# Patient Record
Sex: Female | Born: 1975 | Race: Asian | Hispanic: No | Marital: Single | Smoking: Former smoker
Health system: Southern US, Community
[De-identification: ages and names within clinical notes are randomized; demographics above are authoritative.]

## PROBLEM LIST (undated history)

## (undated) DIAGNOSIS — I10 Essential (primary) hypertension: Secondary | ICD-10-CM

## (undated) DIAGNOSIS — Z8742 Personal history of other diseases of the female genital tract: Secondary | ICD-10-CM

## (undated) DIAGNOSIS — J45909 Unspecified asthma, uncomplicated: Secondary | ICD-10-CM

## (undated) DIAGNOSIS — F32A Depression, unspecified: Secondary | ICD-10-CM

## (undated) DIAGNOSIS — F329 Major depressive disorder, single episode, unspecified: Secondary | ICD-10-CM

## (undated) HISTORY — DX: Personal history of other diseases of the female genital tract: Z87.42

## (undated) HISTORY — DX: Depression, unspecified: F32.A

## (undated) HISTORY — DX: Essential (primary) hypertension: I10

## (undated) HISTORY — DX: Unspecified asthma, uncomplicated: J45.909

## (undated) HISTORY — PX: NO PAST SURGERIES: SHX2092

## (undated) HISTORY — DX: Major depressive disorder, single episode, unspecified: F32.9

---

## 2017-08-03 ENCOUNTER — Encounter: Payer: Self-pay | Admitting: Family Medicine

## 2017-08-03 ENCOUNTER — Ambulatory Visit: Payer: BLUE CROSS/BLUE SHIELD | Admitting: Family Medicine

## 2017-08-03 VITALS — BP 172/112 | HR 82 | Temp 99.3°F | Ht 64.0 in | Wt 227.0 lb

## 2017-08-03 DIAGNOSIS — Z720 Tobacco use: Secondary | ICD-10-CM

## 2017-08-03 DIAGNOSIS — I10 Essential (primary) hypertension: Secondary | ICD-10-CM

## 2017-08-03 DIAGNOSIS — Z309 Encounter for contraceptive management, unspecified: Secondary | ICD-10-CM | POA: Diagnosis not present

## 2017-08-03 DIAGNOSIS — Z23 Encounter for immunization: Secondary | ICD-10-CM

## 2017-08-03 DIAGNOSIS — E282 Polycystic ovarian syndrome: Secondary | ICD-10-CM | POA: Diagnosis not present

## 2017-08-03 LAB — BASIC METABOLIC PANEL
BUN: 13 mg/dL (ref 7–25)
CHLORIDE: 100 mmol/L (ref 98–110)
CO2: 29 mmol/L (ref 20–32)
Calcium: 9.5 mg/dL (ref 8.6–10.2)
Creat: 0.68 mg/dL (ref 0.50–1.10)
GLUCOSE: 96 mg/dL (ref 65–99)
POTASSIUM: 4 mmol/L (ref 3.5–5.3)
SODIUM: 138 mmol/L (ref 135–146)

## 2017-08-03 LAB — POCT URINE PREGNANCY: Preg Test, Ur: NEGATIVE

## 2017-08-03 MED ORDER — METFORMIN HCL 500 MG PO TABS: ORAL_TABLET | ORAL | 1 refills | Status: DC

## 2017-08-03 MED ORDER — CHLORTHALIDONE 25 MG PO TABS: 12.5000 mg | ORAL_TABLET | Freq: Every day | ORAL | 1 refills | Status: DC

## 2017-08-03 NOTE — Progress Notes (Signed)
Pre visit review using our clinic review tool, if applicable. No additional management support is needed unless otherwise documented below in the visit note. 

## 2017-08-03 NOTE — Progress Notes (Signed)
Chief Complaint  Patient presents with  . Establish Care       New Patient Visit SUBJECTIVE: HPI: Tresea MallShanti Suman is an 42 y.o.female who is being seen for establishing care.  The patient was previously seen at office in [place].  Hypertension Patient presents for hypertension follow up. She does monitor home blood pressures. She is not currently on any medications. Had cough with ACEi. Metoprolol did not help with her issue. She is now adhering to a healthy diet overall. Exercise: walking +Famhx of HTN.  +20 yr hx of smoking, 1/2 ppd. Has tried Chantix in past, has never had PCV23.   PCOS- has been on Nuvaring in past, wondering if there is anything else, would also like something for contraception.   Allergies  Allergen Reactions  . Lisinopril Cough    Side effect    Past Medical History:  Diagnosis Date  . Depression   . History of PCOS   . Hypertension   . UTI (urinary tract infection)    History reviewed. No pertinent surgical history.  Family History  Problem Relation Age of Onset  . Depression Mother   . Hypertension Mother   . Depression Father   . Hypertension Father   . Hyperlipidemia Father   . Heart disease Father   . Heart attack Father      Current Outpatient Medications:  .  chlorthalidone (HYGROTON) 25 MG tablet, Take 0.5 tablets (12.5 mg total) by mouth daily., Disp: 30 tablet, Rfl: 1 .  metFORMIN (GLUCOPHAGE) 500 MG tablet, Week 1: 1 tab daily Week 2: 1 tab twice daily Week 3: 2 tabs in AM, 1 in evening Week 4: 2 tabs twice daily, Disp: 120 tablet, Rfl: 1  ROS Cardiovascular: Denies chest pain  Respiratory: Denies dyspnea   OBJECTIVE: BP (!) 172/112 (BP Location: Left Arm, Patient Position: Sitting, Cuff Size: Large)   Pulse 82   Temp 99.3 F (37.4 C) (Oral)   Ht 5\' 4"  (1.626 m)   Wt 227 lb (103 kg)   SpO2 98%   BMI 38.96 kg/m   Constitutional: -  VS reviewed -  Well developed, well nourished, appears stated age -  No apparent  distress  Psychiatric: -  Oriented to person, place, and time -  Memory intact -  Affect and mood normal -  Fluent conversation, good eye contact -  Judgment and insight age appropriate  Eye: -  Conjunctivae clear, no discharge -  Pupils symmetric, round, reactive to light  ENMT: -  MMM    Pharynx moist, no exudate, no erythema  Neck: -  No gross swelling, no palpable masses -  Thyroid midline, not enlarged, mobile, no palpable masses  Cardiovascular: -  RRR -  No LE edema  Respiratory: -  Normal respiratory effort, no accessory muscle use, no retraction -  Breath sounds equal, no wheezes, no ronchi, no crackles  Musculoskeletal: -  No clubbing, no cyanosis -  Gait normal  Skin: -  No significant lesion on inspection -  Warm and dry to palpation   ASSESSMENT/PLAN: PCOS (polycystic ovarian syndrome) - Plan: metFORMIN (GLUCOPHAGE) 500 MG tablet  Encounter for contraceptive management, unspecified type - Plan: POCT urine pregnancy  Tobacco abuse  Essential hypertension - Plan: chlorthalidone (HYGROTON) 25 MG tablet, Basic metabolic panel  Need for vaccination against Streptococcus pneumoniae - Plan: Pneumococcal polysaccharide vaccine 23-valent greater than or equal to 2yo subcutaneous/IM  Patient instructed to sign release of records form from her previous PCP. Start metformin.  Estrogen-containing formulations are contraindicated as she is greater than 30 yo and smokes.  She does request some form of contraception and is interested in Nexplanon.  We will check a urine pregnancy today, which is neg, and again in 2 weeks.  We will also recheck a BMP in 2 weeks.  Counseled on diet and exercise.  Challenged her to do yoga and lift weights.  Healthy diet handout given. Patient should return in 2 weeks. The patient voiced understanding and agreement to the plan.   Jilda Roche [place], DO 08/03/17  5:02 PM

## 2017-08-03 NOTE — Patient Instructions (Addendum)
Aim to do some physical exertion for 150 minutes per week. This is typically divided into 5 days per week, 30 minutes per day. The activity should be enough to get your heart rate up. Anything is better than nothing if you have time constraints. Consider lifting weights, yoga or resistance bands.    No unprotected intercourse between now and in 2 weeks.   Healthy Eating Plan Many factors influence your heart health, including eating and exercise habits. Heart (coronary) risk increases with abnormal blood fat (lipid) levels. Heart-healthy meal planning includes limiting unhealthy fats, increasing healthy fats, and making other small dietary changes. This includes maintaining a healthy body weight to help keep lipid levels within a normal range.  WHAT IS MY PLAN?  Your health care provider recommends that you:  Drink a glass of water before meals to help with satiety.  Eat slowly.  An alternative to the water is to add Metamucil. This will help with satiety as well. It does contain calories, unlike water.  WHAT TYPES OF FAT SHOULD I CHOOSE?  Choose healthy fats more often. Choose monounsaturated and polyunsaturated fats, such as olive oil and canola oil, flaxseeds, walnuts, almonds, and seeds.  Eat more omega-3 fats. Good choices include salmon, mackerel, sardines, tuna, flaxseed oil, and ground flaxseeds. Aim to eat fish at least two times each week.  Avoid foods with partially hydrogenated oils in them. These contain trans fats. Examples of foods that contain trans fats are stick margarine, some tub margarines, cookies, crackers, and other baked goods. If you are going to avoid a fat, this is the one to avoid!  WHAT GENERAL GUIDELINES DO I NEED TO FOLLOW?  Check food labels carefully to identify foods with trans fats. Avoid these types of options when possible.  Fill one half of your plate with vegetables and green salads. Eat 4-5 servings of vegetables per day. A serving of vegetables  equals 1 cup of raw leafy vegetables,  cup of raw or cooked cut-up vegetables, or  cup of vegetable juice.  Fill one fourth of your plate with whole grains. Look for the word "whole" as the first word in the ingredient list.  Fill one fourth of your plate with lean protein foods.  Eat 4-5 servings of fruit per day. A serving of fruit equals one medium whole fruit,  cup of dried fruit,  cup of fresh, frozen, or canned fruit. Try to avoid fruits in cups/syrups as the sugar content can be high.  Eat more foods that contain soluble fiber. Examples of foods that contain this type of fiber are apples, broccoli, carrots, beans, peas, and barley. Aim to get 20-30 g of fiber per day.  Eat more home-cooked food and less restaurant, buffet, and fast food.  Limit or avoid alcohol.  Limit foods that are high in starch and sugar.  Avoid fried foods when able.  Cook foods by using methods other than frying. Baking, boiling, grilling, and broiling are all great options. Other fat-reducing suggestions include: ? Removing the skin from poultry. ? Removing all visible fats from meats. ? Skimming the fat off of stews, soups, and gravies before serving them. ? Steaming vegetables in water or broth.  Lose weight if you are overweight. Losing just 5-10% of your initial body weight can help your overall health and prevent diseases such as diabetes and heart disease.  Increase your consumption of nuts, legumes, and seeds to 4-5 servings per week. One serving of dried beans or legumes equals  cup after being cooked, one serving of nuts equals 1 ounces, and one serving of seeds equals  ounce or 1 tablespoon.  WHAT ARE GOOD FOODS CAN I EAT? Grains Grainy breads (try to find bread that is 3 g of fiber per slice or greater), oatmeal, light popcorn. Whole-grain cereals. Rice and pasta, including brown rice and those that are made with whole wheat. Edamame pasta is a great alternative to grain pasta. It has a  higher protein content. Try to avoid significant consumption of white bread, sugary cereals, or pastries/baked goods.  Vegetables All vegetables. Cooked white potatoes do not count as vegetables.  Fruits All fruits, but limit pineapple and bananas as these fruits have a higher sugar content.  Meats and Other Protein Sources Lean, well-trimmed beef, veal, pork, and lamb. Chicken and [place] without skin. All fish and shellfish. Wild duck, rabbit, pheasant, and venison. Egg whites or low-cholesterol egg substitutes. Dried beans, peas, lentils, and tofu.Seeds and most nuts.  Dairy Low-fat or nonfat cheeses, including ricotta, string, and mozzarella. Skim or 1% milk that is liquid, powdered, or evaporated. Buttermilk that is made with low-fat milk. Nonfat or low-fat yogurt. Soy/Almond milk are good alternatives if you cannot handle dairy.  Beverages Water is the best for you. Sports drinks with less sugar are more desirable unless you are a highly active athlete.  Sweets and Desserts Sherbets and fruit ices. Honey, jam, marmalade, jelly, and syrups. Dark chocolate.  Eat all sweets and desserts in moderation.  Fats and Oils Nonhydrogenated (trans-free) margarines. Vegetable oils, including soybean, sesame, sunflower, olive, peanut, safflower, corn, canola, and cottonseed. Salad dressings or mayonnaise that are made with a vegetable oil. Limit added fats and oils that you use for cooking, baking, salads, and as spreads.  Other Cocoa powder. Coffee and tea. Most condiments.  The items listed above may not be a complete list of recommended foods or beverages. Contact your dietitian for more options.

## 2017-08-08 ENCOUNTER — Telehealth: Payer: Self-pay | Admitting: *Deleted

## 2017-08-08 ENCOUNTER — Encounter: Payer: Self-pay | Admitting: Family Medicine

## 2017-08-08 NOTE — Telephone Encounter (Signed)
[facility] Henry MD; forwarded to provider/SLS 04/17

## 2017-08-17 ENCOUNTER — Ambulatory Visit: Payer: BLUE CROSS/BLUE SHIELD | Admitting: Family Medicine

## 2017-08-27 ENCOUNTER — Encounter: Payer: Self-pay | Admitting: Family Medicine

## 2017-08-27 ENCOUNTER — Ambulatory Visit: Payer: BLUE CROSS/BLUE SHIELD | Admitting: Family Medicine

## 2017-08-27 VITALS — BP 154/92 | HR 97 | Temp 98.6°F | Ht 64.0 in | Wt 222.1 lb

## 2017-08-27 DIAGNOSIS — I10 Essential (primary) hypertension: Secondary | ICD-10-CM | POA: Diagnosis not present

## 2017-08-27 DIAGNOSIS — Z30017 Encounter for initial prescription of implantable subdermal contraceptive: Secondary | ICD-10-CM

## 2017-08-27 LAB — POCT URINE PREGNANCY: Preg Test, Ur: NEGATIVE

## 2017-08-27 MED ORDER — ETONOGESTREL 68 MG ~~LOC~~ IMPL
68.0000 mg | DRUG_IMPLANT | Freq: Once | SUBCUTANEOUS | Status: AC
  Administered 2017-08-27: 68 mg via SUBCUTANEOUS

## 2017-08-27 MED ORDER — CHLORTHALIDONE 25 MG PO TABS: 25.0000 mg | ORAL_TABLET | Freq: Every day | ORAL | 1 refills | Status: DC

## 2017-08-27 NOTE — Progress Notes (Signed)
Chief Complaint  Patient presents with  . Procedure    nexplanon    Subjective Philis Doke is a 42 y.o. adult who presents for hypertension follow up. She was started on 12.5 mg chlorthalidone. She is compliant with medication- chlorthalidone 12.5 mg/d. Patient has these side effects of medication: none She is adhering to a healthy diet overall. Current exercise: walking  She is also here for insertion of Nexplanon. She was explained the risks and benefits and wishes to proceed. Pregnancy test neg on 4/12 and today.  Past Medical History:  Diagnosis Date  . Asthma   . Depression   . History of PCOS   . Hypertension     Review of Systems Cardiovascular: no chest pain Respiratory:  no shortness of breath  Exam BP (!) 154/92 (BP Location: Right Arm, Patient Position: Sitting, Cuff Size: Large)   Pulse 97   Temp 98.6 F (37 C) (Oral)   Ht  (1.626 m)   Wt 222 lb 2 oz (100.8 kg)   SpO2 97%   BMI 38.13 kg/m  General:  well developed, well nourished, in no apparent distress Heart: RRR, no bruits, no LE edema Lungs: clear to auscultation, no accessory muscle use Psych: well oriented with normal range of affect and appropriate judgment/insight  Procedure Note, Nexplanon placement: Informed consent obtained. The patient was placed in a comfortable supine position and the non-dominant arm, left, was positioned to access the sulcus between the bicep and triceps muscles.  Measurements were made, and markings were placed at 8 cm, 10 cm, 12 cm and 14 cm from the medial epicondyle of the humerus, 2 cm posterior to sulcus. The area was prepped with Hibiclens and a 30 gauge needle was used to inject 3 mL of 1% lidocaine with epinephrine.  Sterile gloves were then utilized. The Nexplanon trocar was then inserted at the end of anesthetized area and pushed gently through the subcutaneous tissue along the line of the sulcus.  The seal was broken and the implant was held in place  while the trocar was withdrawn.  The implant was then palpated by both me, pt declined.   The arm was cleansed and the puncture site from the trocar covered with triple antibiotic ointment and pressure dressing.  Hemostasis was observed at the site. There were no complications noted.  The patient tolerated the procedure well.  She was instructed that the device must be removed in three years.  Essential hypertension - Plan: chlorthalidone (HYGROTON) 25 MG tablet, Basic Metabolic Panel (BMET)  Nexplanon insertion - Plan: POCT urine pregnancy, etonogestrel (NEXPLANON) implant 68 mg  Orders as above. Increase dose of chlorthalidone to 25 mg/d from 12.5 mg/d. Ck BMP today. Nexplanon insertion success. Pt declined palpation. Counseled on diet and exercise. F/u in 1 mo. The patient voiced understanding and agreement to the plan.  Jilda Roche [place], DO 08/27/17  4:34 PM

## 2017-08-27 NOTE — Progress Notes (Signed)
Pre visit review using our clinic review tool, if applicable. No additional management support is needed unless otherwise documented below in the visit note. 

## 2017-08-27 NOTE — Patient Instructions (Signed)
No unprotected intercourse (or use back up contraception) for the next 7 days.  Do not shower for the rest of the day. When you do wash it, use only soap and water. Do not vigorously scrub. Apply triple antibiotic ointment (like Neosporin) twice daily. Keep the area clean and dry.   Things to look out for: increasing pain not relieved by ibuprofen/acetaminophen, fevers, spreading redness, drainage of pus, or foul odor.  Keep checking blood pressure.   Start taking 1 tab daily.   Let us know if you need anything.

## 2017-08-28 LAB — BASIC METABOLIC PANEL
BUN: 18 mg/dL (ref 6–23)
CALCIUM: 9.8 mg/dL (ref 8.4–10.5)
CO2: 30 meq/L (ref 19–32)
Chloride: 104 mEq/L (ref 96–112)
Creatinine, Ser: 0.98 mg/dL (ref 0.40–1.20)
GFR: 66.46 mL/min (ref >60.00)
Glucose, Bld: 82 mg/dL (ref 70–99)
Potassium: 5 mEq/L (ref 3.5–5.1)
SODIUM: 143 meq/L (ref 135–145)

## 2017-09-28 ENCOUNTER — Ambulatory Visit (INDEPENDENT_AMBULATORY_CARE_PROVIDER_SITE_OTHER): Payer: BLUE CROSS/BLUE SHIELD

## 2017-09-28 DIAGNOSIS — E282 Polycystic ovarian syndrome: Secondary | ICD-10-CM | POA: Diagnosis not present

## 2017-09-28 DIAGNOSIS — I10 Essential (primary) hypertension: Secondary | ICD-10-CM | POA: Diagnosis not present

## 2017-09-28 MED ORDER — METFORMIN HCL 500 MG PO TABS: ORAL_TABLET | ORAL | 1 refills | Status: DC

## 2017-09-28 MED ORDER — AMLODIPINE BESYLATE 5 MG PO TABS: 5.0000 mg | ORAL_TABLET | Freq: Every day | ORAL | 1 refills | Status: DC

## 2017-09-28 NOTE — Progress Notes (Signed)
Pt here for Blood pressure check per Dr. Carmelia RollerWendling  Pt currently takes: Hygroton 25mg  take 1 tablet daily   Pt reports compliance with medication: None  BP today @ =175/93 HR =91  Pt advised per PCP start  amlodipine  5mg  daily. Added to the hygroton. Patient made aware and voiced her understanding.

## 2017-09-28 NOTE — Progress Notes (Signed)
Noted. Agree with above.  Jasmine Higgins [place], DO 09/28/17 4:56 PM

## 2017-10-10 ENCOUNTER — Encounter: Payer: Self-pay | Admitting: Family Medicine

## 2017-10-12 ENCOUNTER — Encounter: Payer: Self-pay | Admitting: Family Medicine

## 2017-10-12 DIAGNOSIS — I10 Essential (primary) hypertension: Secondary | ICD-10-CM

## 2017-10-12 MED ORDER — CHLORTHALIDONE 25 MG PO TABS: 25.0000 mg | ORAL_TABLET | Freq: Every day | ORAL | 1 refills | Status: DC

## 2017-10-13 ENCOUNTER — Other Ambulatory Visit: Payer: Self-pay | Admitting: Family Medicine

## 2017-10-13 DIAGNOSIS — E282 Polycystic ovarian syndrome: Secondary | ICD-10-CM

## 2017-10-29 ENCOUNTER — Encounter: Payer: Self-pay | Admitting: Family Medicine

## 2017-10-29 ENCOUNTER — Ambulatory Visit: Payer: BLUE CROSS/BLUE SHIELD | Admitting: Family Medicine

## 2017-10-29 VITALS — BP 128/90 | HR 92 | Temp 98.5°F | Resp 16 | Wt 213.6 lb

## 2017-10-29 DIAGNOSIS — N939 Abnormal uterine and vaginal bleeding, unspecified: Secondary | ICD-10-CM

## 2017-10-29 DIAGNOSIS — I10 Essential (primary) hypertension: Secondary | ICD-10-CM | POA: Diagnosis not present

## 2017-10-29 MED ORDER — AMLODIPINE BESYLATE 10 MG PO TABS: 10.0000 mg | ORAL_TABLET | Freq: Every day | ORAL | 3 refills | Status: DC

## 2017-10-29 MED ORDER — NORGESTIMATE-ETH ESTRADIOL 0.25-35 MG-MCG PO TABS: ORAL_TABLET | ORAL | 11 refills | Status: DC

## 2017-10-29 MED ORDER — METFORMIN HCL 1000 MG PO TABS: 1000.0000 mg | ORAL_TABLET | Freq: Two times a day (BID) | ORAL | 3 refills | Status: DC

## 2017-10-29 NOTE — Patient Instructions (Addendum)
Take 2 tabs daily of amlodipine until you run out. I have called in a new strength.   A new dose of your metformin has been called in.  Keep up the great work with losing weight, eating cleaner, and exercising.  Let us know if you need anything.

## 2017-10-29 NOTE — Progress Notes (Signed)
Chief Complaint  Patient presents with  . Hypertension    Subjective Jasmine Higgins is a 42 y.o. female who presents for hypertension follow up. She does not monitor home blood pressures. She is compliant with medications- Norvasc 5 mg/d, chlorthalidone 25 mg/d. Patient has these side effects of medication: none She is adhering to a healthy diet overall. Current exercise: walking  Has bleeding on Nexplanon. Has taken Aleve which has made it lighter, but not fully better yet.    Past Medical History:  Diagnosis Date  . Asthma   . Depression   . History of PCOS   . Hypertension     Review of Systems Cardiovascular: no chest pain Respiratory:  no shortness of breath  Exam BP 128/90 (BP Location: Left Arm, Patient Position: Sitting, Cuff Size: Large)   Pulse 92   Temp 98.5 F (36.9 C) (Oral)   Resp 16   Wt 213 lb 9.6 oz (96.9 kg)   SpO2 96%   BMI 36.66 kg/m  General:  well developed, well nourished, in no apparent distress Heart: RRR, no bruits, no LE edema Lungs: clear to auscultation, no accessory muscle use Psych: well oriented with normal range of affect and appropriate judgment/insight  Essential hypertension - Plan: amLODipine (NORVASC) 10 MG tablet  Increase dose of above to 10 mg/d. Counseled on diet and exercise. She is doing pretty well. [facility] breakthrough bleeding on implant. F/u in 6 weeks. The patient voiced understanding and agreement to the plan.  Jilda Rocheicholas [place], DO 10/29/17  3:56 PM

## 2017-11-11 ENCOUNTER — Encounter: Payer: Self-pay | Admitting: Family Medicine

## 2017-11-14 NOTE — Telephone Encounter (Signed)
Pt calling back and states she can come in tomorrow at 4:15pm. Please schedule pt. I do not have security to be able to place the pt on the schedule at that time.

## 2017-11-14 NOTE — Telephone Encounter (Signed)
Please schedule appt for Nexplanon removal. TY.

## 2017-11-15 ENCOUNTER — Encounter: Payer: Self-pay | Admitting: Family Medicine

## 2017-11-15 ENCOUNTER — Ambulatory Visit: Payer: BLUE CROSS/BLUE SHIELD | Admitting: Family Medicine

## 2017-11-15 VITALS — BP 150/89 | HR 86 | Temp 98.2°F | Resp 16 | Ht 64.0 in | Wt 215.0 lb

## 2017-11-15 DIAGNOSIS — Z3046 Encounter for surveillance of implantable subdermal contraceptive: Secondary | ICD-10-CM | POA: Diagnosis not present

## 2017-11-15 MED ORDER — ETONOGESTREL-ETHINYL ESTRADIOL 0.12-0.015 MG/24HR VA RING: VAGINAL_RING | VAGINAL | 12 refills | Status: DC

## 2017-11-15 NOTE — Progress Notes (Signed)
Procedure note; Nexplanon removal Informed consent obtained. The patient was placed in the supine position with her left arm abducted and external rotated, exposing the medial surface of her arm. The Nexplanon was palpated and the distal edge was elevated with pressure to the proximal end. This area was then cleaned with an alcohol swab. Approximately 1 cc of 1% lidocaine without epinephrine was injected deep to the palpated trocar. While pressure was applied to the proximal side of the trocar, an 11-blade scalpel was used to make a small incision over the anesthetized area, parallel to the trocar. The trocar was visualized and scar tissue was dissected around it. It was grasped with a curved hemostat and removed.  The area was wrapped with a pressure bandage. Adequate hemostasis was obtained. The patient tolerated the procedure well. There were no complications noted.  Nuvaring to replace Nexplanon. Apologized for inconvenience.  F/u as originally scheduled. Pt voiced understanding and agreement to the plan.  Jilda Rocheicholas Paul Wendling 4:53 PM 11/15/17

## 2017-11-15 NOTE — Patient Instructions (Signed)
I am sorry this did not work out!  Do not shower for the rest of the day. When you do wash it, use only soap and water. Do not vigorously scrub. Apply triple antibiotic ointment (like Neosporin) twice daily. Keep the area clean and dry.   Things to look out for: increasing pain not relieved by ibuprofen/acetaminophen, fevers, spreading redness, drainage of pus, or foul odor.  Bleeding may continue for the next few months, but should start to taper. We can stay on OCP also.   Let us know if you need anything.

## 2017-12-03 ENCOUNTER — Ambulatory Visit: Payer: BLUE CROSS/BLUE SHIELD | Admitting: Family Medicine

## 2017-12-03 ENCOUNTER — Encounter: Payer: Self-pay | Admitting: Family Medicine

## 2017-12-03 VITALS — BP 142/84 | HR 100 | Temp 98.9°F | Ht 64.0 in | Wt 212.0 lb

## 2017-12-03 DIAGNOSIS — I10 Essential (primary) hypertension: Secondary | ICD-10-CM | POA: Diagnosis not present

## 2017-12-03 MED ORDER — ETONOGESTREL-ETHINYL ESTRADIOL 0.12-0.015 MG/24HR VA RING: VAGINAL_RING | VAGINAL | 3 refills | Status: DC

## 2017-12-03 MED ORDER — AMLODIPINE BESYLATE-VALSARTAN 10-160 MG PO TABS: 1.0000 | ORAL_TABLET | Freq: Every day | ORAL | 2 refills | Status: DC

## 2017-12-03 NOTE — Progress Notes (Signed)
Chief Complaint  Patient presents with  . Hypertension    Subjective Jasmine Higgins is a 42 y.o. female who presents for hypertension follow up. She does not monitor home blood pressures. She is compliant with medications- chlorthalidone 25 mg/d and Norvasc 10 mg/d. Patient has these side effects of medication: none She is adhering to a healthy diet overall. Current exercise: walking   Past Medical History:  Diagnosis Date  . Asthma   . Depression   . History of PCOS   . Hypertension     Review of Systems Cardiovascular: no chest pain Respiratory:  no shortness of breath  Exam BP (!) 142/84 (BP Location: Left Arm, Patient Position: Sitting, Cuff Size: Large)   Pulse 100   Temp 98.9 F (37.2 C) (Oral)   Ht 5\' 4"  (1.626 m)   Wt 212 lb (96.2 kg)   SpO2 98%   BMI 36.39 kg/m  General:  well developed, well nourished, in no apparent distress Heart: RRR, no bruits, no LE edema Lungs: clear to auscultation, no accessory muscle use Psych: well oriented with normal range of affect and appropriate judgment/insight  Essential hypertension - Plan: amLODipine-valsartan (EXFORGE) 10-160 MG tablet  Orders as above. Add ARB to regimen. Ck BP's at home.  Counseled on diet and exercise. F/u in 1 mo. The patient voiced understanding and agreement to the plan.  Jilda Rocheicholas [place], DO 12/03/17  3:31 PM

## 2017-12-03 NOTE — Patient Instructions (Addendum)
Around 3 times per week, check your blood pressure 4 times per day. Twice in the morning and twice in the evening. The readings should be at least one minute apart. Write down these values and bring them to your next nurse visit/appointment.  When you check your BP, make sure you have been doing something calm/relaxing 5 minutes prior to checking. Both feet should be flat on the floor and you should be sitting. Use your left arm and make sure it is in a relaxed position (on a table), and that the cuff is at the approximate level/height of your heart.  Blood pressure monitor options include Omron (upper arm), Citizen (upper arm), Balance (upper arm), Medline (upper arm) and Boots (upper arm). Feel free to so research of your own. Wrist cuffs at a local pharmacy may be the cheapest option. To make sure your blood pressure cuff is accurate, bring it to your appointment.  Aim to do some physical exertion for 150 minutes per week. This is typically divided into 5 days per week, 30 minutes per day. The activity should be enough to get your heart rate up. Anything is better than nothing if you have time constraints.  Keep the diet clean.  Stop taking amlodipine (Norvasc).  Let us know if you need anything.

## 2017-12-03 NOTE — Progress Notes (Signed)
Pre visit review using our clinic review tool, if applicable. No additional management support is needed unless otherwise documented below in the visit note. 

## 2017-12-10 ENCOUNTER — Other Ambulatory Visit: Payer: Self-pay | Admitting: Family Medicine

## 2017-12-10 DIAGNOSIS — I10 Essential (primary) hypertension: Secondary | ICD-10-CM

## 2018-01-11 ENCOUNTER — Other Ambulatory Visit: Payer: Self-pay | Admitting: Family Medicine

## 2018-01-11 DIAGNOSIS — I10 Essential (primary) hypertension: Secondary | ICD-10-CM

## 2018-01-21 ENCOUNTER — Encounter: Payer: Self-pay | Admitting: Family Medicine

## 2018-01-21 ENCOUNTER — Ambulatory Visit: Payer: BLUE CROSS/BLUE SHIELD | Admitting: Family Medicine

## 2018-01-21 VITALS — BP 120/78 | HR 84 | Temp 98.2°F | Ht 64.0 in | Wt 212.0 lb

## 2018-01-21 DIAGNOSIS — Z23 Encounter for immunization: Secondary | ICD-10-CM

## 2018-01-21 DIAGNOSIS — F411 Generalized anxiety disorder: Secondary | ICD-10-CM | POA: Diagnosis not present

## 2018-01-21 DIAGNOSIS — I10 Essential (primary) hypertension: Secondary | ICD-10-CM | POA: Diagnosis not present

## 2018-01-21 MED ORDER — SERTRALINE HCL 50 MG PO TABS: 50.0000 mg | ORAL_TABLET | Freq: Every day | ORAL | 1 refills | Status: DC

## 2018-01-21 NOTE — Progress Notes (Signed)
Pre visit review using our clinic review tool, if applicable. No additional management support is needed unless otherwise documented below in the visit note. 

## 2018-01-21 NOTE — Addendum Note (Signed)
Addended by: Scharlene Gloss B on: 01/21/2018 04:53 PM   Modules accepted: Orders

## 2018-01-21 NOTE — Progress Notes (Signed)
CC: HTN  Subjective Jasmine Higgins is a 42 y.o. female who presents for hypertension follow up. She does monitor home blood pressures. Blood pressures ranging from 140-150's/90-100's on average. She is compliant with medications- Exforge 10-160 mg/d, Chlorthalidone 25 mg/d. Patient has these side effects of medication: none She is generally adhering to a healthy diet overall. Current exercise: walking  Hx of GAD. Was on Celexa in past. Having trouble sleeping, easily agitated. Not currently following with counselor or psychologist. No SI or HI. No self medication.   Past Medical History:  Diagnosis Date  . Asthma   . Depression   . History of PCOS   . Hypertension     Review of Systems Cardiovascular: no chest pain Respiratory:  no shortness of breath  Exam BP 120/78 (BP Location: Left Arm, Patient Position: Sitting, Cuff Size: Large)   Pulse 84   Temp 98.2 F (36.8 C) (Oral)   Ht 5\' 4"  (1.626 m)   Wt 212 lb (96.2 kg)   SpO2 97%   BMI 36.39 kg/m  General:  well developed, well nourished, in no apparent distress Heart: RRR, no bruits, no LE edema Lungs: clear to auscultation, no accessory muscle use Psych: well oriented with normal range of affect and appropriate judgment/insight  Essential hypertension  GAD (generalized anxiety disorder) - Plan: sertraline (ZOLOFT) 50 MG tablet  Cont meds for HTN. LB BH # provided, coping mechs provided. Start SSRI again, 1/2 tab daily for 2 weeks then 1 tab.  Counseled on diet and exercise. F/u in 6 weeks. The patient voiced understanding and agreement to the plan.  Jilda Roche [place], DO 01/21/18  4:43 PM

## 2018-01-21 NOTE — Patient Instructions (Addendum)
Please consider counseling. Contact [phone] to schedule an appointment or inquire about cost/insurance coverage.  Aim to do some physical exertion for 150 minutes per week. This is typically divided into 5 days per week, 30 minutes per day. The activity should be enough to get your heart rate up. Anything is better than nothing if you have time constraints.  Keep the diet clean.   Coping skills Choose 5 that work for you:  Take a deep breath  Count to 20  Read a book  Do a puzzle  Meditate  Bake  Sing  Knit  Garden  Pray  Go outside  Call a friend  Listen to music  Take a walk  Color  Send a note  Take a bath  Watch a movie  Be alone in a quiet place  Pet an animal  Visit a friend  Journal  Exercise  Stretch   Let us know if you need anything.    

## 2018-03-04 ENCOUNTER — Ambulatory Visit: Payer: BLUE CROSS/BLUE SHIELD | Admitting: Family Medicine

## 2018-03-04 ENCOUNTER — Encounter: Payer: Self-pay | Admitting: Family Medicine

## 2018-03-04 VITALS — BP 128/80 | HR 89 | Temp 98.1°F | Ht 64.0 in | Wt 209.1 lb

## 2018-03-04 DIAGNOSIS — F411 Generalized anxiety disorder: Secondary | ICD-10-CM | POA: Diagnosis not present

## 2018-03-04 DIAGNOSIS — I10 Essential (primary) hypertension: Secondary | ICD-10-CM | POA: Diagnosis not present

## 2018-03-04 MED ORDER — AMLODIPINE BESYLATE-VALSARTAN 10-160 MG PO TABS: 1.0000 | ORAL_TABLET | Freq: Every day | ORAL | 2 refills | Status: DC

## 2018-03-04 MED ORDER — CHLORTHALIDONE 25 MG PO TABS: ORAL_TABLET | ORAL | 2 refills | Status: DC

## 2018-03-04 MED ORDER — SERTRALINE HCL 50 MG PO TABS: 50.0000 mg | ORAL_TABLET | Freq: Every day | ORAL | 2 refills | Status: DC

## 2018-03-04 NOTE — Progress Notes (Signed)
Pre visit review using our clinic review tool, if applicable. No additional management support is needed unless otherwise documented below in the visit note. 

## 2018-03-04 NOTE — Patient Instructions (Signed)
Aim to do some physical exertion for 150 minutes per week. This is typically divided into 5 days per week, 30 minutes per day. The activity should be enough to get your heart rate up. Anything is better than nothing if you have time constraints.  Keep the diet clean.  Let us know if you need anything.

## 2018-03-04 NOTE — Progress Notes (Signed)
Chief Complaint  Patient presents with  . Follow-up    zoloft     Subjective Jasmine Higgins presents for f/u anxiety/depression.  She is currently being treated with Zoloft 50 mg/d.  Reports 80% improvement since treatment. No thoughts of harming self or others. No self-medication with alcohol, prescription drugs or illicit drugs. Pt is following with a counselor/psychologist.  Hypertension Patient presents for hypertension follow up. She does monitor home blood pressures. Blood pressures ranging on average from 130's/80's. Her meter runs higher than ours. She is compliant with medications- Chlorthalidone 25 mg/d, Exforge 10-360 mg/d. Patient has these side effects of medication: none She is adhering to a healthy diet overall. Exercise: none   ROS Psych: No homicidal or suicidal thoughts  Past Medical History:  Diagnosis Date  . Asthma   . Depression   . History of PCOS   . Hypertension    Exam BP 128/80 (BP Location: Left Arm, Patient Position: Sitting, Cuff Size: Normal)   Pulse 89   Temp 98.1 F (36.7 C) (Oral)   Ht 5\' 4"  (1.626 m)   Wt 209 lb 2 oz (94.9 kg)   SpO2 98%   BMI 35.90 kg/m  General:  well developed, well nourished, in no apparent distress Neck: neck supple without adenopathy, thyromegaly, or masses Lungs:  clear to auscultation, breath sounds equal bilaterally, no respiratory distress Cardio:  regular rate and rhythm without murmurs, heart sounds without clicks or rubs Psych: well oriented with normal range of affect and age-appropriate judgement/insight, alert and oriented x4.  Assessment and Plan  GAD (generalized anxiety disorder) - Plan: sertraline (ZOLOFT) 50 MG tablet  Essential hypertension - Plan: amLODipine-valsartan (EXFORGE) 10-160 MG tablet, chlorthalidone (HYGROTON) 25 MG tablet  Morbid obesity (HCC)  Orders as above. Counseled on diet and exercise. Cont current meds.  F/u in 6 mo. Will discuss current need for Zoloft at that  time. Goal weight: 190-195 lbs The patient voiced understanding and agreement to the plan.  Jilda Roche [place], DO 03/04/18 4:07 PM

## 2018-09-30 ENCOUNTER — Other Ambulatory Visit: Payer: Self-pay | Admitting: Family Medicine

## 2018-10-28 ENCOUNTER — Other Ambulatory Visit: Payer: Self-pay

## 2018-10-29 ENCOUNTER — Ambulatory Visit: Payer: BC Managed Care – PPO | Admitting: Family Medicine

## 2018-10-29 ENCOUNTER — Encounter: Payer: Self-pay | Admitting: Family Medicine

## 2018-10-29 VITALS — BP 118/72 | HR 97 | Temp 98.7°F | Ht 64.0 in | Wt 221.0 lb

## 2018-10-29 DIAGNOSIS — R631 Polydipsia: Secondary | ICD-10-CM | POA: Diagnosis not present

## 2018-10-29 DIAGNOSIS — N3001 Acute cystitis with hematuria: Secondary | ICD-10-CM

## 2018-10-29 DIAGNOSIS — R5383 Other fatigue: Secondary | ICD-10-CM | POA: Diagnosis not present

## 2018-10-29 DIAGNOSIS — R0683 Snoring: Secondary | ICD-10-CM

## 2018-10-29 LAB — POCT URINALYSIS DIPSTICK
Bilirubin, UA: NEGATIVE
Blood, UA: POSITIVE
Glucose, UA: NEGATIVE
Ketones, UA: NEGATIVE
Nitrite, UA: NEGATIVE
Protein, UA: POSITIVE — AB
Spec Grav, UA: 1.015 (ref 1.010–1.025)
Urobilinogen, UA: 0.2 E.U./dL
pH, UA: 7 (ref 5.0–8.0)

## 2018-10-29 LAB — VITAMIN D 25 HYDROXY (VIT D DEFICIENCY, FRACTURES): VITD: 44.4 ng/mL (ref 30.00–100.00)

## 2018-10-29 LAB — T4, FREE: Free T4: 1.37 ng/dL (ref 0.60–1.60)

## 2018-10-29 LAB — HEMOGLOBIN A1C: Hgb A1c MFr Bld: 7.1 % — ABNORMAL HIGH (ref 4.6–6.5)

## 2018-10-29 LAB — TSH: TSH: 2.89 u[IU]/mL (ref 0.35–4.50)

## 2018-10-29 MED ORDER — SULFAMETHOXAZOLE-TRIMETHOPRIM 800-160 MG PO TABS: 1.0000 | ORAL_TABLET | Freq: Two times a day (BID) | ORAL | 0 refills | Status: DC

## 2018-10-29 NOTE — Progress Notes (Signed)
Chief Complaint  Patient presents with  . Urinary Urgency    Jasmine Higgins is a 44 y.o. female here for possible UTI.  Duration: 1 week. Symptoms: urinary frequency, urinary hesitancy and dysuria Denies: hematuria, urinary hesitancy, fever, nausea, vomiting, flank pain, discharge Hx of recurrent UTI? No Denies new sexual partners.  She quite smoking around 6 mo ago. Has gained around 12 lbs. Increased fatigue, napping. Will sometimes feel that she is gagging when she lays down. Has not had sleep study before.   Has been thirstier. Takes metformin for PCOS. No recent A1c.   ROS:  Constitutional: denies fever GU: As noted in HPI  Past Medical History:  Diagnosis Date  . Asthma   . Depression   . History of PCOS   . Hypertension     BP 118/72 (BP Location: Left Arm, Patient Position: Sitting, Cuff Size: Large)   Pulse 97   Temp 98.7 F (37.1 C) (Oral)   Ht 5\' 4"  (1.626 m)   Wt 221 lb (100.2 kg)   SpO2 98%   BMI 37.93 kg/m  General: Awake, alert, appears stated age Heart: RRR Lungs: CTAB, normal respiratory effort, no accessory muscle usage Abd: BS+, soft, NT, ND, no masses or organomegaly MSK: No CVA tenderness, neg Lloyd's sign Psych: Age appropriate judgment and insight  Acute cystitis with hematuria - Plan: POCT Urinalysis Dipstick, Urine Culture, 3 d of bid Bactrim DS.   Polydipsia - Plan: Hemoglobin A1c  Fatigue, unspecified type - Plan: Ambulatory referral to Pulmonology, T4, free, TSH, Vitamin D (25 hydroxy)  Snoring - Plan: Ambulatory referral to Pulmonology  Orders as above. Stay hydrated. Seek immediate care if pt starts to develop fevers, new/worsening symptoms, uncontrollable N/V. F/u prn. The patient voiced understanding and agreement to the plan.  [place], DO 10/29/18 11:52 AM

## 2018-10-29 NOTE — Patient Instructions (Addendum)
Give [place] 2-3 business days to get the results of your labs back.   Stay hydrated.   Warning signs/symptoms: Uncontrollable nausea/vomiting, fevers, worsening symptoms despite treatment, confusion.  Give [place] around 2 business days to get culture back to you.  If you do not hear anything about your referral in the next 1-2 weeks, call our office and ask for an update.  Let us know if you need anything.

## 2018-10-31 LAB — URINE CULTURE
MICRO NUMBER:: 641233
SPECIMEN QUALITY:: ADEQUATE

## 2018-11-05 ENCOUNTER — Encounter: Payer: Self-pay | Admitting: Family Medicine

## 2018-11-05 ENCOUNTER — Ambulatory Visit: Payer: BC Managed Care – PPO | Admitting: Family Medicine

## 2018-11-05 ENCOUNTER — Other Ambulatory Visit: Payer: Self-pay

## 2018-11-05 ENCOUNTER — Other Ambulatory Visit: Payer: Self-pay | Admitting: Family Medicine

## 2018-11-05 VITALS — BP 120/80 | HR 99 | Temp 98.3°F | Ht 64.0 in | Wt 220.0 lb

## 2018-11-05 DIAGNOSIS — L74512 Primary focal hyperhidrosis, palms: Secondary | ICD-10-CM | POA: Diagnosis not present

## 2018-11-05 DIAGNOSIS — L74513 Primary focal hyperhidrosis, soles: Secondary | ICD-10-CM

## 2018-11-05 DIAGNOSIS — E1169 Type 2 diabetes mellitus with other specified complication: Secondary | ICD-10-CM | POA: Diagnosis not present

## 2018-11-05 DIAGNOSIS — E669 Obesity, unspecified: Secondary | ICD-10-CM

## 2018-11-05 DIAGNOSIS — F411 Generalized anxiety disorder: Secondary | ICD-10-CM

## 2018-11-05 DIAGNOSIS — K219 Gastro-esophageal reflux disease without esophagitis: Secondary | ICD-10-CM

## 2018-11-05 DIAGNOSIS — I10 Essential (primary) hypertension: Secondary | ICD-10-CM

## 2018-11-05 LAB — MICROALBUMIN / CREATININE URINE RATIO
Creatinine,U: 125.5 mg/dL
Microalb Creat Ratio: 1.9 mg/g (ref 0.0–30.0)
Microalb, Ur: 2.4 mg/dL — ABNORMAL HIGH (ref 0.0–1.9)

## 2018-11-05 MED ORDER — PANTOPRAZOLE SODIUM 40 MG PO TBEC: 40.0000 mg | DELAYED_RELEASE_TABLET | Freq: Every day | ORAL | 3 refills | Status: DC

## 2018-11-05 MED ORDER — ALUMINUM CHLORIDE 20 % EX SOLN: Freq: Every day | CUTANEOUS | 0 refills | Status: DC

## 2018-11-05 MED ORDER — ATORVASTATIN CALCIUM 40 MG PO TABS: 40.0000 mg | ORAL_TABLET | Freq: Every day | ORAL | 3 refills | Status: DC

## 2018-11-05 NOTE — Progress Notes (Signed)
CC: Lab f/u  Subjective: Patient is a 43 y.o. female here for elevated a1c.  Pt recently had a1c that was 7.1. She takes metformin 1 g bid for PCOS. +famhx on dad's side. She is not on a statin. She has had PCV23. Does follow with an eye doctor. Started to cut down on sugars. No exercise.   Reflux is not controlled on Pepcid. Worse after meals. No unintentional wt loss or bleeding. Has been feeling bloated as well. +incomplete emptying w BM's. Tries to make sure she eats lots of veggies. Not on a fiber supp.  +excessive sweating on feet and hands. Does not seem to be related to temperature or clothing. Has never tried anything for this before.   ROS: Endo: +wt gain GI: +reflux  Past Medical History:  Diagnosis Date  . Asthma   . Depression   . History of PCOS   . Hypertension     Objective: BP 120/80 (BP Location: Left Arm, Patient Position: Sitting, Cuff Size: Large)   Pulse 99   Temp 98.3 F (36.8 C) (Oral)   Ht 5\' 4"  (1.626 m)   Wt 220 lb (99.8 kg)   SpO2 98%   BMI 37.76 kg/m  General: Awake, appears stated age Skin: no ext lesions, hydrosis noted b/l feet and palms Neuro: Sensation intact to pinprick b/l Lungs: CTAB, no rales, wheezes or rhonchi. No accessory muscle use Psych: Age appropriate judgment and insight, normal affect and mood  Assessment and Plan: Diabetes mellitus type 2 in obese ([place]) - Plan: atorvastatin (LIPITOR) 40 MG tablet, HM DIABETES FOOT EXAM, Microalbumin / creatinine urine ratio, she is to call eye provider.   Gastroesophageal reflux disease, esophagitis presence not specified - Plan: pantoprazole (PROTONIX) 40 MG tablet, reflux precautions discussed.  Hyperhidrosis of palms and soles - Plan: aluminum chloride (DRYSOL) 20 % external solution, let us know if too expensive.  F/u in 3 mo. The patient voiced understanding and agreement to the plan.  [place], DO 11/05/18  8:58 AM

## 2018-11-05 NOTE — Patient Instructions (Addendum)
Give [place] 2-3 business days to get the results of your labs back.   Keep the diet clean and stay active.  Aim to do some physical exertion for 150 minutes per week. This is typically divided into 5 days per week, 30 minutes per day. The activity should be enough to get your heart rate up. Anything is better than nothing if you have time constraints.  The only lifestyle changes that have data behind them are weight loss for the overweight/obese and elevating the head of the bed. Finding out which foods/positions are triggers is important.  Let your eye doctor know about your new diagnosis.  Schedule an exam when able.  Let us know if you need anything.

## 2018-11-06 ENCOUNTER — Encounter: Payer: Self-pay | Admitting: Family Medicine

## 2018-11-07 ENCOUNTER — Other Ambulatory Visit: Payer: Self-pay | Admitting: Family Medicine

## 2018-11-07 MED ORDER — NITROFURANTOIN MACROCRYSTAL 100 MG PO CAPS: 100.0000 mg | ORAL_CAPSULE | Freq: Two times a day (BID) | ORAL | 0 refills | Status: AC

## 2018-11-13 ENCOUNTER — Other Ambulatory Visit: Payer: Self-pay | Admitting: Family Medicine

## 2018-11-13 DIAGNOSIS — I10 Essential (primary) hypertension: Secondary | ICD-10-CM

## 2018-12-27 ENCOUNTER — Other Ambulatory Visit: Payer: Self-pay | Admitting: *Deleted

## 2018-12-27 MED ORDER — METFORMIN HCL 1000 MG PO TABS: ORAL_TABLET | ORAL | 1 refills | Status: DC

## 2019-01-27 ENCOUNTER — Ambulatory Visit (INDEPENDENT_AMBULATORY_CARE_PROVIDER_SITE_OTHER): Payer: BC Managed Care – PPO | Admitting: Pulmonary Disease

## 2019-01-27 ENCOUNTER — Encounter: Payer: Self-pay | Admitting: Pulmonary Disease

## 2019-01-27 ENCOUNTER — Other Ambulatory Visit: Payer: Self-pay

## 2019-01-27 VITALS — BP 132/66 | HR 88 | Ht 64.0 in | Wt 220.8 lb

## 2019-01-27 DIAGNOSIS — R0683 Snoring: Secondary | ICD-10-CM | POA: Diagnosis not present

## 2019-01-27 NOTE — Progress Notes (Signed)
Subjective:     Patient ID: Jasmine Higgins, female   DOB: 1975-07-12, 43 y.o.   MRN: [id number]  Patient being seen for history of snoring  History of snoring Wakes up occasionally with difficulty breathing Wheezing when she is going to get to sleep  Admits to dryness of her mouth in the mornings, occasional morning headaches  Dad snored heavily  Significant weight gain over the years, lost about 20 pounds recently  Usually goes to bed between 10:11 PM Takes about 30 minutes to fall asleep  wakes up at least twice during the night Final wake up time about 7 AM  Denies any significant memory issues  No history of lung disease   Past Medical History:  Diagnosis Date  . Asthma   . Depression   . History of PCOS   . Hypertension    Social History   Socioeconomic History  . Marital status: Single    Spouse name: Not on file  . Number of children: Not on file  . Years of education: Not on file  . Highest education level: Not on file  Occupational History  . Not on file  Social Needs  . Financial resource strain: Not on file  . Food insecurity    Worry: Not on file    Inability: Not on file  . Transportation needs    Medical: Not on file    Non-medical: Not on file  Tobacco Use  . Smoking status: Former Smoker    Packs/day: 0.50    Types: Cigarettes    Quit date: 04/28/2018    Years since quitting: 0.7  . Smokeless tobacco: Never Used  Substance and Sexual Activity  . Alcohol use: Never    Frequency: Never  . Drug use: Never  . Sexual activity: Not on file  Lifestyle  . Physical activity    Days per week: Not on file    Minutes per session: Not on file  . Stress: Not on file  Relationships  . Social Herbalist on phone: Not on file    Gets together: Not on file    Attends religious service: Not on file    Active member of club or organization: Not on file    Attends meetings of clubs or organizations: Not on file    Relationship status: Not  on file  . Intimate partner violence    Fear of current or ex partner: Not on file    Emotionally abused: Not on file    Physically abused: Not on file    Forced sexual activity: Not on file  Other Topics Concern  . Not on file  Social History Narrative  . Not on file   Family History  Problem Relation Age of Onset  . Depression Mother   . Hypertension Mother   . Depression Father   . Hypertension Father   . Hyperlipidemia Father   . Heart disease Father   . Heart attack Father     Review of Systems  Constitutional: Positive for unexpected weight change. Negative for fever.  HENT: Negative for congestion, dental problem, ear pain, nosebleeds, postnasal drip, rhinorrhea, sinus pressure, sneezing, sore throat and trouble swallowing.   Eyes: Negative for redness and itching.  Respiratory: Positive for wheezing. Negative for cough, chest tightness and shortness of breath.   Cardiovascular: Negative for palpitations and leg swelling.  Gastrointestinal: Negative for nausea and vomiting.  Genitourinary: Negative for dysuria.  Musculoskeletal: Negative for joint swelling.  Skin:  Negative for rash.  Allergic/Immunologic: Negative.  Negative for environmental allergies, food allergies and immunocompromised state.  Neurological: Positive for headaches.  Hematological: Does not bruise/bleed easily.  Psychiatric/Behavioral: Negative for dysphoric mood. The patient is not nervous/anxious.        Objective:   Physical Exam Constitutional:      Appearance: She is obese.  HENT:     Head: Normocephalic and atraumatic.     Nose: Nose normal.     Mouth/Throat:     Mouth: Mucous membranes are moist.     Comments: Crowded oropharynx, Mallampati 4 Eyes:     General:        Right eye: No discharge.        Left eye: No discharge.     Extraocular Movements: Extraocular movements intact.     Pupils: Pupils are equal, round, and reactive to light.  Neck:     Musculoskeletal: Normal range  of motion and neck supple. No neck rigidity or muscular tenderness.  Cardiovascular:     Rate and Rhythm: Normal rate and regular rhythm.     Pulses: Normal pulses.     Heart sounds: Normal heart sounds. No murmur. No friction rub.  Pulmonary:     Effort: Pulmonary effort is normal. No respiratory distress.     Breath sounds: Normal breath sounds. No stridor. No wheezing or rhonchi.  Abdominal:     General: There is no distension.     Palpations: There is no mass.  Musculoskeletal: Normal range of motion.        General: No swelling or tenderness.  Skin:    General: Skin is warm and dry.     Coloration: Skin is not jaundiced or pale.  Neurological:     General: No focal deficit present.     Mental Status: She is alert.     Cranial Nerves: No cranial nerve deficit.  Psychiatric:        Mood and Affect: Mood normal.    Results of the Epworth flowsheet 01/27/2019  Sitting and reading 3  Watching TV 2  Sitting, inactive in a public place (e.g. a theatre or a meeting) 1  As a passenger in a car for an hour without a break 0  Lying down to rest in the afternoon when circumstances permit 3  Sitting and talking to someone 0  Sitting quietly after a lunch without alcohol 1  In a car, while stopped for a few minutes in traffic 0  Total score 10      Assessment:     High probability of significant obstructive sleep apnea -Crowding of the oropharynx, daytime sleepiness  Obesity  Nonrestorative sleep  Polycystic ovarian syndrome    Plan:     Pathophysiology of sleep disordered breathing discussed with the patient  Treatment options for sleep disordered breathing discussed with patient  We will schedule patient for home sleep study  We will see her back in the office in about 3 months

## 2019-01-27 NOTE — Patient Instructions (Signed)
Moderate to high probability of significant obstructive sleep apnea  We will order home sleep study We will inform you as results become available  Continue weight loss efforts  I will see you back in the office in about 3 months

## 2019-01-29 ENCOUNTER — Other Ambulatory Visit: Payer: Self-pay | Admitting: *Deleted

## 2019-01-29 DIAGNOSIS — K219 Gastro-esophageal reflux disease without esophagitis: Secondary | ICD-10-CM

## 2019-01-29 MED ORDER — PANTOPRAZOLE SODIUM 40 MG PO TBEC: 40.0000 mg | DELAYED_RELEASE_TABLET | Freq: Every day | ORAL | 1 refills | Status: DC

## 2019-01-31 ENCOUNTER — Other Ambulatory Visit: Payer: Self-pay

## 2019-01-31 ENCOUNTER — Ambulatory Visit (INDEPENDENT_AMBULATORY_CARE_PROVIDER_SITE_OTHER): Payer: BC Managed Care – PPO | Admitting: Family Medicine

## 2019-01-31 ENCOUNTER — Encounter: Payer: Self-pay | Admitting: Family Medicine

## 2019-01-31 VITALS — BP 112/80 | HR 111 | Temp 96.2°F | Ht 64.0 in | Wt 221.4 lb

## 2019-01-31 DIAGNOSIS — Z23 Encounter for immunization: Secondary | ICD-10-CM | POA: Diagnosis not present

## 2019-01-31 DIAGNOSIS — Z0000 Encounter for general adult medical examination without abnormal findings: Secondary | ICD-10-CM

## 2019-01-31 DIAGNOSIS — E1169 Type 2 diabetes mellitus with other specified complication: Secondary | ICD-10-CM | POA: Diagnosis not present

## 2019-01-31 DIAGNOSIS — E669 Obesity, unspecified: Secondary | ICD-10-CM

## 2019-01-31 DIAGNOSIS — Z114 Encounter for screening for human immunodeficiency virus [HIV]: Secondary | ICD-10-CM

## 2019-01-31 DIAGNOSIS — Z1231 Encounter for screening mammogram for malignant neoplasm of breast: Secondary | ICD-10-CM

## 2019-01-31 MED ORDER — ONETOUCH VERIO VI STRP: ORAL_STRIP | 3 refills | Status: DC

## 2019-01-31 MED ORDER — ETONOGESTREL-ETHINYL ESTRADIOL 0.12-0.015 MG/24HR VA RING: VAGINAL_RING | VAGINAL | 3 refills | Status: DC

## 2019-01-31 NOTE — Progress Notes (Signed)
Chief Complaint  Patient presents with  . Annual Exam     Well Woman Jasmine Higgins is here for a complete physical.   Her last physical was >1 year ago.  Current diet: in general, a "healthy" diet. Current exercise: cycling, walking. Weight is stable and she denies daytime fatigue. No LMP recorded.  Seatbelt? Yes  Health Maintenance Pap/HPV- No Mammogram- No Tetanus- Yes HIV screening- No  Past Medical History:  Diagnosis Date  . Asthma   . Depression   . History of PCOS   . Hypertension      Past Surgical History:  Procedure Laterality Date  . NO PAST SURGERIES      Medications  Current Outpatient Medications on File Prior to Visit  Medication Sig Dispense Refill  . aluminum chloride (DRYSOL) 20 % external solution Apply topically at bedtime. 35 mL 0  . amLODipine-valsartan (EXFORGE) 10-160 MG tablet TAKE 1 TABLET BY MOUTH DAILY 90 tablet 2  . atorvastatin (LIPITOR) 40 MG tablet Take 1 tablet (40 mg total) by mouth daily. 90 tablet 3  . chlorthalidone (HYGROTON) 25 MG tablet TAKE 1 TABLET(25 MG) BY MOUTH DAILY 90 tablet 2  . etonogestrel-ethinyl estradiol (NUVARING) 0.12-0.015 MG/24HR vaginal ring Insert vaginally and leave in place for 3 consecutive weeks, then remove for 1 week. 3 each 3  . metFORMIN (GLUCOPHAGE) 1000 MG tablet TAKE 1 TABLET(1000 MG) BY MOUTH TWICE DAILY WITH A MEAL 180 tablet 1  . pantoprazole (PROTONIX) 40 MG tablet Take 1 tablet (40 mg total) by mouth daily. 90 tablet 1  . sertraline (ZOLOFT) 50 MG tablet TAKE 1 TABLET(50 MG) BY MOUTH DAILY 90 tablet 2   Allergies Allergies  Allergen Reactions  . Lisinopril Cough    Side effect    Review of Systems: Constitutional:  no unexpected weight changes Eye:  no recent significant change in vision Ear/Nose/Mouth/Throat:  Ears:  no tinnitus or vertigo and no recent change in hearing Nose/Mouth/Throat:  no complaints of nasal congestion, no sore throat Cardiovascular: no chest pain Respiratory:   no cough and no shortness of breath Gastrointestinal:  no abdominal pain, no change in bowel habits GU:  Female: negative for dysuria or pelvic pain Musculoskeletal/Extremities:  no pain of the joints Integumentary (Skin/Breast):  no abnormal skin lesions reported Neurologic:  no headaches Endocrine:  denies fatigue Hematologic/Lymphatic:  No areas of easy bleeding  Exam BP 112/80 (BP Location: Left Arm, Patient Position: Sitting, Cuff Size: Large)   Pulse (!) 111   Temp (!) 96.2 F (35.7 C) (Temporal)   Ht 5\' 4"  (1.626 m)   Wt 221 lb 6 oz (100.4 kg)   SpO2 98%   BMI 38.00 kg/m  General:  well developed, well nourished, in no apparent distress Skin:  no significant moles, warts, or growths Head:  no masses, lesions, or tenderness Eyes:  pupils equal and round, sclera anicteric without injection Ears:  canals without lesions, TMs shiny without retraction, no obvious effusion, no erythema Nose:  nares patent, septum midline, mucosa normal, and no drainage or sinus tenderness Throat/Pharynx:  lips and gingiva without lesion; tongue and uvula midline; non-inflamed pharynx; no exudates or postnasal drainage Neck: neck supple without adenopathy, thyromegaly, or masses Lungs:  clear to auscultation, breath sounds equal bilaterally, no respiratory distress Cardio:  regular rate and rhythm (HR of 84 on my exam), no bruits, no LE edema Abdomen:  abdomen soft, nontender; bowel sounds normal; no masses or organomegaly Genital: Defer to GYN Musculoskeletal:  symmetrical muscle groups  noted without atrophy or deformity Extremities:  no clubbing, cyanosis, or edema, no deformities, no skin discoloration Neuro:  gait normal; deep tendon reflexes normal and symmetric Psych: well oriented with normal range of affect and appropriate judgment/insight  Assessment and Plan  Well adult exam - Plan: CBC, Comprehensive metabolic panel, Lipid panel, CANCELED: Lipid panel, CANCELED: CBC, CANCELED:  Comprehensive metabolic panel  Need for influenza vaccination - Plan: Flu Vaccine QUAD 6+ mos PF IM (Fluarix Quad PF)  Diabetes mellitus type 2 in obese ([place]) - Plan: Hemoglobin A1c, CANCELED: Hemoglobin A1c  Screening for HIV (human immunodeficiency virus) - Plan: HIV Antibody (routine testing w rflx)  Encounter for screening mammogram for malignant neoplasm of breast - Plan: MM DIGITAL SCREENING BILATERAL   Well 43 y.o. female. Counseled on diet and exercise. Other orders as above. Follow up in 6 mo or prn. The patient voiced understanding and agreement to the plan.  Jasmine Mandry, DO 01/31/19 4:01 PM

## 2019-01-31 NOTE — Patient Instructions (Addendum)
Give [place] 2-3 business days to get the results of your labs back.   Keep the diet clean and stay active.  Someone will reach out to you regarding your mammogram.  Call your GYN for an appt, you need to have your pap smear.    Call Center for [place] at [facility] at [phone] for an appointment.  They are located at [address], [address], [place], [place], [address] (right across the hall from our office).  Let us know if you need anything.

## 2019-02-01 LAB — COMPREHENSIVE METABOLIC PANEL
AG Ratio: 1.3 (calc) (ref 1.0–2.5)
ALT: 40 U/L — ABNORMAL HIGH (ref 6–29)
AST: 25 U/L (ref 10–30)
Albumin: 4 g/dL (ref 3.6–5.1)
Alkaline phosphatase (APISO): 65 U/L (ref 31–125)
BUN: 14 mg/dL (ref 7–25)
CO2: 27 mmol/L (ref 20–32)
Calcium: 9.6 mg/dL (ref 8.6–10.2)
Chloride: 101 mmol/L (ref 98–110)
Creat: 0.62 mg/dL (ref 0.50–1.10)
Globulin: 3 g/dL (calc) (ref 1.9–3.7)
Glucose, Bld: 196 mg/dL — ABNORMAL HIGH (ref 65–99)
Potassium: 3.9 mmol/L (ref 3.5–5.3)
Sodium: 140 mmol/L (ref 135–146)
Total Bilirubin: 0.3 mg/dL (ref 0.2–1.2)
Total Protein: 7 g/dL (ref 6.1–8.1)

## 2019-02-01 LAB — CBC
HCT: 37.4 % (ref 35.0–45.0)
Hemoglobin: 12 g/dL (ref 11.7–15.5)
MCH: 27.6 pg (ref 27.0–33.0)
MCHC: 32.1 g/dL (ref 32.0–36.0)
MCV: 86.2 fL (ref 80.0–100.0)
MPV: 8.7 fL (ref 7.5–12.5)
Platelets: 473 10*3/uL — ABNORMAL HIGH (ref 140–400)
RBC: 4.34 10*6/uL (ref 3.80–5.10)
RDW: 13.2 % (ref 11.0–15.0)
WBC: 11.7 10*3/uL — ABNORMAL HIGH (ref 3.8–10.8)

## 2019-02-01 LAB — LIPID PANEL
Cholesterol: 139 mg/dL (ref <200)
HDL: 38 mg/dL — ABNORMAL LOW (ref >=50)
LDL Cholesterol (Calc): 70 mg/dL (calc)
Non-HDL Cholesterol (Calc): 101 mg/dL (calc) (ref <130)
Total CHOL/HDL Ratio: 3.7 (calc) (ref <5.0)
Triglycerides: 211 mg/dL — ABNORMAL HIGH (ref <150)

## 2019-02-01 LAB — HEMOGLOBIN A1C
Hgb A1c MFr Bld: 7.7 % of total Hgb — ABNORMAL HIGH (ref <5.7)
Mean Plasma Glucose: 174 (calc)
eAG (mmol/L): 9.7 (calc)

## 2019-02-01 LAB — HIV ANTIBODY (ROUTINE TESTING W REFLEX): HIV 1&2 Ab, 4th Generation: NONREACTIVE

## 2019-02-03 ENCOUNTER — Encounter: Payer: Self-pay | Admitting: Family Medicine

## 2019-02-03 ENCOUNTER — Other Ambulatory Visit: Payer: Self-pay | Admitting: Family Medicine

## 2019-02-03 DIAGNOSIS — I10 Essential (primary) hypertension: Secondary | ICD-10-CM

## 2019-02-03 DIAGNOSIS — E1169 Type 2 diabetes mellitus with other specified complication: Secondary | ICD-10-CM

## 2019-02-04 ENCOUNTER — Other Ambulatory Visit: Payer: Self-pay | Admitting: Family Medicine

## 2019-02-04 ENCOUNTER — Ambulatory Visit: Payer: BC Managed Care – PPO

## 2019-02-04 ENCOUNTER — Other Ambulatory Visit: Payer: Self-pay

## 2019-02-04 DIAGNOSIS — G4733 Obstructive sleep apnea (adult) (pediatric): Secondary | ICD-10-CM

## 2019-02-04 DIAGNOSIS — R0683 Snoring: Secondary | ICD-10-CM

## 2019-02-04 MED ORDER — OZEMPIC (0.25 OR 0.5 MG/DOSE) 2 MG/1.5ML ~~LOC~~ SOPN: 0.5000 mg | PEN_INJECTOR | SUBCUTANEOUS | 5 refills | Status: DC

## 2019-02-07 DIAGNOSIS — G4733 Obstructive sleep apnea (adult) (pediatric): Secondary | ICD-10-CM

## 2019-02-10 ENCOUNTER — Telehealth: Payer: Self-pay | Admitting: Pulmonary Disease

## 2019-02-10 ENCOUNTER — Encounter: Payer: Self-pay | Admitting: Family Medicine

## 2019-02-10 DIAGNOSIS — G4733 Obstructive sleep apnea (adult) (pediatric): Secondary | ICD-10-CM

## 2019-02-10 NOTE — Telephone Encounter (Signed)
Dr. Ander Slade has reviewed the home sleep test this showed severe obstructive sleep apnea.   Recommendations   Treatment options are CPAP with the settings auto 5 to 20.    Weight loss measures .   Advise against driving while sleepy & against medication with sedative side effects.    Make appointment for 3 months for compliance with download with Dr. Ander Slade.   Patient aware of results and recommendations. DME order for cpap placed. Patient scheduled for 03/2819, 0915 for follow up.

## 2019-02-17 ENCOUNTER — Other Ambulatory Visit: Payer: BC Managed Care – PPO

## 2019-02-17 ENCOUNTER — Other Ambulatory Visit (INDEPENDENT_AMBULATORY_CARE_PROVIDER_SITE_OTHER): Payer: BC Managed Care – PPO

## 2019-02-17 ENCOUNTER — Other Ambulatory Visit: Payer: Self-pay

## 2019-02-17 DIAGNOSIS — E669 Obesity, unspecified: Secondary | ICD-10-CM | POA: Diagnosis not present

## 2019-02-17 DIAGNOSIS — E1169 Type 2 diabetes mellitus with other specified complication: Secondary | ICD-10-CM

## 2019-02-17 DIAGNOSIS — R799 Abnormal finding of blood chemistry, unspecified: Secondary | ICD-10-CM

## 2019-02-17 DIAGNOSIS — I10 Essential (primary) hypertension: Secondary | ICD-10-CM

## 2019-02-17 LAB — COMPREHENSIVE METABOLIC PANEL
ALT: 15 U/L (ref 0–35)
AST: 11 U/L (ref 0–37)
Albumin: 3.9 g/dL (ref 3.5–5.2)
Alkaline Phosphatase: 62 U/L (ref 39–117)
BUN: 16 mg/dL (ref 6–23)
CO2: 25 mEq/L (ref 19–32)
Calcium: 9.5 mg/dL (ref 8.4–10.5)
Chloride: 101 mEq/L (ref 96–112)
Creatinine, Ser: 0.67 mg/dL (ref 0.40–1.20)
GFR: 96.29 mL/min (ref >60.00)
Glucose, Bld: 223 mg/dL — ABNORMAL HIGH (ref 70–99)
Potassium: 3.8 mEq/L (ref 3.5–5.1)
Sodium: 137 mEq/L (ref 135–145)
Total Bilirubin: 0.2 mg/dL (ref 0.2–1.2)
Total Protein: 7.2 g/dL (ref 6.0–8.3)

## 2019-02-17 LAB — CBC WITH DIFFERENTIAL/PLATELET
Basophils Absolute: 0.1 10*3/uL (ref 0.0–0.1)
Basophils Relative: 0.6 % (ref 0.0–3.0)
Eosinophils Absolute: 0.5 10*3/uL (ref 0.0–0.7)
Eosinophils Relative: 4.2 % (ref 0.0–5.0)
HCT: 36.3 % (ref 36.0–46.0)
Hemoglobin: 12.1 g/dL (ref 12.0–15.0)
Lymphocytes Relative: 19.7 % (ref 12.0–46.0)
Lymphs Abs: 2.5 10*3/uL (ref 0.7–4.0)
MCHC: 33.3 g/dL (ref 30.0–36.0)
MCV: 84.9 fl (ref 78.0–100.0)
Monocytes Absolute: 0.6 10*3/uL (ref 0.1–1.0)
Monocytes Relative: 4.5 % (ref 3.0–12.0)
Neutro Abs: 9.2 10*3/uL — ABNORMAL HIGH (ref 1.4–7.7)
Neutrophils Relative %: 71 % (ref 43.0–77.0)
Platelets: 528 10*3/uL — ABNORMAL HIGH (ref 150.0–400.0)
RBC: 4.28 Mil/uL (ref 3.87–5.11)
RDW: 14.5 % (ref 11.5–15.5)
WBC: 12.9 10*3/uL — ABNORMAL HIGH (ref 4.0–10.5)

## 2019-02-17 LAB — LIPID PANEL
Cholesterol: 128 mg/dL (ref 0–200)
HDL: 40.9 mg/dL (ref >39.00)
LDL Cholesterol: 55 mg/dL (ref 0–99)
NonHDL: 86.88
Total CHOL/HDL Ratio: 3
Triglycerides: 158 mg/dL — ABNORMAL HIGH (ref 0.0–149.0)
VLDL: 31.6 mg/dL (ref 0.0–40.0)

## 2019-02-17 NOTE — Addendum Note (Signed)
Addended by: Caffie Pinto on: 02/17/2019 12:58 PM   Modules accepted: Orders

## 2019-02-18 LAB — CBC (INCLUDES DIFF/PLT) WITH PATHOLOGIST REVIEW
Absolute Monocytes: 616 cells/uL (ref 200–950)
Basophils Absolute: 52 cells/uL (ref 0–200)
Basophils Relative: 0.4 %
Eosinophils Absolute: 537 cells/uL — ABNORMAL HIGH (ref 15–500)
Eosinophils Relative: 4.1 %
HCT: 37.8 % (ref 35.0–45.0)
Hemoglobin: 12.2 g/dL (ref 11.7–15.5)
Lymphs Abs: 2646 cells/uL (ref 850–3900)
MCH: 27.6 pg (ref 27.0–33.0)
MCHC: 32.3 g/dL (ref 32.0–36.0)
MCV: 85.5 fL (ref 80.0–100.0)
MPV: 8.7 fL (ref 7.5–12.5)
Monocytes Relative: 4.7 %
Neutro Abs: 9249 cells/uL — ABNORMAL HIGH (ref 1500–7800)
Neutrophils Relative %: 70.6 %
Platelets: 499 10*3/uL — ABNORMAL HIGH (ref 140–400)
RBC: 4.42 10*6/uL (ref 3.80–5.10)
RDW: 13.3 % (ref 11.0–15.0)
Total Lymphocyte: 20.2 %
WBC: 13.1 10*3/uL — ABNORMAL HIGH (ref 3.8–10.8)

## 2019-02-18 NOTE — Telephone Encounter (Signed)
Dr. Ander Slade,  They called me regarding my Cpap machine, and to be honest I couldn't afford the fees. Is there any other way that is cheaper and my insurance would cover it?   Thank you,  Jasmine Higgins  --------------------  Pt is unable to afford the copays for her cpap, but she does have BCBS.  Are there any copay assistance programs or maybe a cheaper DME that pt can be set up with?   PCCs please advise.  Thanks!

## 2019-02-23 ENCOUNTER — Encounter: Payer: Self-pay | Admitting: Family Medicine

## 2019-02-24 NOTE — Telephone Encounter (Signed)
CPAP assistance programs resolved I am familiar with as well  In a situation where she cannot afford it at all It is unfortunate that its not covered by insurance  The only option she has will be to focus on weight loss, exercise, avoid supine sleep as able-all efforts to reduce the severity of the sleep disordered breathing

## 2019-02-24 NOTE — Telephone Encounter (Signed)
Dr. Ander Slade please advise on below email- patient is unable to afford copays associated with cpap.  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  Please advise if you have any other suggestions/alternatives for this patient.  Thanks!

## 2019-02-25 ENCOUNTER — Other Ambulatory Visit: Payer: Self-pay | Admitting: Family Medicine

## 2019-02-25 DIAGNOSIS — F411 Generalized anxiety disorder: Secondary | ICD-10-CM

## 2019-02-26 ENCOUNTER — Encounter: Payer: Self-pay | Admitting: Family Medicine

## 2019-02-26 ENCOUNTER — Telehealth: Payer: Self-pay | Admitting: Pulmonary Disease

## 2019-02-26 DIAGNOSIS — G4733 Obstructive sleep apnea (adult) (pediatric): Secondary | ICD-10-CM

## 2019-02-26 NOTE — Telephone Encounter (Signed)
After speaking with Leander Rams, I will handle the signature on the rx and will call pt 02/27/2019. Routing encounter back to myself as I will handle it in the AM

## 2019-02-26 NOTE — Telephone Encounter (Signed)
I have placed order for the Rx so we can fax it to [email or web address] for pt. AO will be back in clinic 03/03/2019 so when he returns, will have him sign this and then will get it faxed for pt.  Called and spoke with pt as the provided fax number that was written down was missing a number. I did get the correct fax number from pt as well as a second fax number and stated to pt that we will call her once we have faxed the info to [email or web address] for her. Pt verbalized understanding.  Routing to [place] T for her to follow up and will also give her the Rx for her to have AO sign on his return.

## 2019-02-27 NOTE — Telephone Encounter (Signed)
Rx for pt's cpap was signed and faxed to both provided fax numbers given by pt. Fax confirmation was received.  Attempted to call pt letting her know that this had been taken care of for her btu unable to reach. Left message for pt to return call.

## 2019-03-05 ENCOUNTER — Other Ambulatory Visit: Payer: Self-pay

## 2019-03-05 ENCOUNTER — Ambulatory Visit (HOSPITAL_BASED_OUTPATIENT_CLINIC_OR_DEPARTMENT_OTHER)
Admission: RE | Admit: 2019-03-05 | Discharge: 2019-03-05 | Disposition: A | Discharge status: Home / Self Care | Payer: BC Managed Care – PPO | Source: Ambulatory Visit | Attending: Family Medicine | Admitting: Family Medicine

## 2019-03-05 DIAGNOSIS — Z1231 Encounter for screening mammogram for malignant neoplasm of breast: Secondary | ICD-10-CM | POA: Diagnosis not present

## 2019-03-07 ENCOUNTER — Other Ambulatory Visit: Payer: Self-pay | Admitting: Family Medicine

## 2019-03-07 DIAGNOSIS — R928 Other abnormal and inconclusive findings on diagnostic imaging of breast: Secondary | ICD-10-CM

## 2019-03-12 ENCOUNTER — Ambulatory Visit
Admission: RE | Admit: 2019-03-12 | Discharge: 2019-03-12 | Disposition: A | Discharge status: Home / Self Care | Payer: BC Managed Care – PPO | Source: Ambulatory Visit | Attending: Family Medicine | Admitting: Family Medicine

## 2019-03-12 ENCOUNTER — Other Ambulatory Visit: Payer: Self-pay

## 2019-03-12 ENCOUNTER — Other Ambulatory Visit: Payer: Self-pay | Admitting: Family Medicine

## 2019-03-12 DIAGNOSIS — R928 Other abnormal and inconclusive findings on diagnostic imaging of breast: Secondary | ICD-10-CM

## 2019-03-12 DIAGNOSIS — N6320 Unspecified lump in the left breast, unspecified quadrant: Secondary | ICD-10-CM

## 2019-03-17 ENCOUNTER — Ambulatory Visit (INDEPENDENT_AMBULATORY_CARE_PROVIDER_SITE_OTHER): Payer: BC Managed Care – PPO | Admitting: Obstetrics & Gynecology

## 2019-03-17 ENCOUNTER — Other Ambulatory Visit: Payer: Self-pay

## 2019-03-17 ENCOUNTER — Encounter: Payer: Self-pay | Admitting: Obstetrics & Gynecology

## 2019-03-17 VITALS — BP 127/84 | HR 92 | Ht 64.0 in | Wt 220.0 lb

## 2019-03-17 DIAGNOSIS — Z124 Encounter for screening for malignant neoplasm of cervix: Secondary | ICD-10-CM | POA: Diagnosis not present

## 2019-03-17 DIAGNOSIS — Z01419 Encounter for gynecological examination (general) (routine) without abnormal findings: Secondary | ICD-10-CM

## 2019-03-17 DIAGNOSIS — Z1151 Encounter for screening for human papillomavirus (HPV): Secondary | ICD-10-CM

## 2019-03-17 DIAGNOSIS — Z113 Encounter for screening for infections with a predominantly sexual mode of transmission: Secondary | ICD-10-CM | POA: Diagnosis not present

## 2019-03-17 NOTE — Progress Notes (Signed)
Subjective:     Jasmine Higgins is a 43 y.o. female here for a routine exam.G0  Current complaints: none. Pt reports that she had an abnormal PAP ~2011. She had 1 normal PAP after that but has not had a recent PAP >%-7 years.   Pt with a h/o PCOS. Was married and unable to conceive. Is now on Nuvaring with good cycle control.   Gynecologic History Patient's last menstrual period was 03/08/2019. Contraception: abstinence. Nuvaring for cycle control.   Last Pap: >7 years perv. Results were: normal Last mammogram: 03/05/2019 and 03/12/2019 . Results were: abnormal- f/u in 1 year  Obstetric History OB History  Gravida Para Term Preterm AB Living  0 0 0 0 0 0  SAB TAB Ectopic Multiple Live Births  0 0 0 0 0   The following portions of the patient's history were reviewed and updated as appropriate: allergies, current medications, past family history, past medical history, past social history, past surgical history and problem list.  Review of Systems Pertinent items are noted in HPI.    Objective:  BP 127/84   Pulse 92   Ht 5\' 4"  (1.626 m)   Wt 220 lb (99.8 kg)   LMP 03/08/2019   BMI 37.76 kg/m  General Appearance:    Alert, cooperative, no distress, appears stated age  Head:    Normocephalic, without obvious abnormality, atraumatic  Eyes:    conjunctiva/corneas clear, EOM's intact, both eyes  Ears:    Normal external ear canals, both ears  Nose:   Nares normal, septum midline, mucosa normal, no drainage    or sinus tenderness  Throat:   Lips, mucosa, and tongue normal; teeth and gums normal  Neck:   Supple, symmetrical, trachea midline, no adenopathy;    thyroid:  no enlargement/tenderness/nodules  Back:     Symmetric, no curvature, ROM normal, no CVA tenderness  Lungs:     respirations unlabored  Chest Wall:    No tenderness or deformity   Heart:    Regular rate and rhythm  Breast Exam:    No tenderness, masses, or nipple abnormality  Abdomen:     Soft, non-tender, bowel sounds  active all four quadrants,    no masses, no organomegaly  Genitalia:    Normal female without lesion, discharge or tenderness     Extremities:   Extremities normal, atraumatic, no cyanosis or edema  Pulses:   2+ and symmetric all extremities  Skin:   Skin color, texture, turgor normal, no rashes or lesions    Assessment:    Healthy female exam.   H/o PCOS- cycles reg on Nuvaring  H/o HPV    Plan:  F/u PAP and hrHPV Rec annual PAPs due to h/o HPV F/u in 1 year or sooner prn Repeat mammogram in 6 months.  STI screen via cx today  Leigh Blas L. Harraway-Smith, M.D., Cherlynn June

## 2019-03-19 LAB — CYTOLOGY - PAP
Chlamydia: NEGATIVE
Comment: NEGATIVE
Comment: NEGATIVE
Comment: NORMAL
Diagnosis: NEGATIVE
High risk HPV: POSITIVE — AB
Neisseria Gonorrhea: NEGATIVE

## 2019-03-24 ENCOUNTER — Telehealth: Payer: Self-pay

## 2019-03-24 NOTE — Telephone Encounter (Signed)
-----   Message from Lavonia Drafts, MD sent at 03/19/2019 11:26 AM EST ----- Please call pt. Her PAP was WNL but, her hrHPv was positive. She needs a repeat PAP in 1 year.   Thx,  clh-S

## 2019-03-24 NOTE — Telephone Encounter (Signed)
Called pt regarding pap smear results. Pt made aware that PAP was WNL, but she will need to repeat PAP in 1 year. Understanding was voiced.  Malvin Morrish l Thuan Tippett, CMA

## 2019-04-21 ENCOUNTER — Ambulatory Visit: Payer: BC Managed Care – PPO | Admitting: Pulmonary Disease

## 2019-04-21 ENCOUNTER — Other Ambulatory Visit: Payer: Self-pay

## 2019-04-21 ENCOUNTER — Encounter: Payer: Self-pay | Admitting: Pulmonary Disease

## 2019-04-21 VITALS — BP 122/80 | HR 99 | Temp 97.1°F | Ht 64.0 in | Wt 214.8 lb

## 2019-04-21 DIAGNOSIS — G4733 Obstructive sleep apnea (adult) (pediatric): Secondary | ICD-10-CM

## 2019-04-21 DIAGNOSIS — Z9989 Dependence on other enabling machines and devices: Secondary | ICD-10-CM

## 2019-04-21 MED ORDER — ESZOPICLONE 2 MG PO TABS: 2.0000 mg | ORAL_TABLET | Freq: Every evening | ORAL | 1 refills | Status: DC | PRN

## 2019-04-21 NOTE — Progress Notes (Signed)
Subjective:     Patient ID: Jasmine Higgins, female   DOB: 01-04-1976, 43 y.o.   MRN: [id number]  Patient being seen for history of snoring Diagnosed with severe obstructive sleep apnea In for follow-up  Has been trying to get used to using CPAP Difficulty with the mask-leaking whenever she moves  Her health is unchanged generally Diabetes-continues to monitor regularly Hypertension-compliant with medications  She tries to get as many hours of sleep at night but still having difficulty tolerating CPAP  Admits to dryness of her mouth in the mornings, occasional morning headaches  Dad snored heavily  Significant weight gain over the years, lost about 20 pounds recently  Usually goes to bed between 10:11 PM Takes about 30 minutes to fall asleep  wakes up at least twice during the night Final wake up time about 7 AM  Denies any significant memory issues  No history of lung disease   Past Medical History:  Diagnosis Date  . Asthma   . Depression   . History of PCOS   . Hypertension    Social History   Socioeconomic History  . Marital status: Single    Spouse name: Not on file  . Number of children: Not on file  . Years of education: Not on file  . Highest education level: Not on file  Occupational History  . Not on file  Tobacco Use  . Smoking status: Former Smoker    Packs/day: 0.50    Years: 20.00    Pack years: 10.00    Types: Cigarettes    Quit date: 04/28/2018    Years since quitting: 0.9  . Smokeless tobacco: Never Used  Substance and Sexual Activity  . Alcohol use: Never  . Drug use: Never  . Sexual activity: Not Currently  Other Topics Concern  . Not on file  Social History Narrative  . Not on file   Social Determinants of Health   Financial Resource Strain:   . Difficulty of Paying Living Expenses: Not on file  Food Insecurity:   . Worried About Programme researcher, broadcasting/film/video in the Last Year: Not on file  . Ran Out of Food in the Last Year: Not on file   Transportation Needs:   . Lack of Transportation (Medical): Not on file  . Lack of Transportation (Non-Medical): Not on file  Physical Activity:   . Days of Exercise per Week: Not on file  . Minutes of Exercise per Session: Not on file  Stress:   . Feeling of Stress : Not on file  Social Connections:   . Frequency of Communication with Friends and Family: Not on file  . Frequency of Social Gatherings with Friends and Family: Not on file  . Attends Religious Services: Not on file  . Active Member of Clubs or Organizations: Not on file  . Attends Banker Meetings: Not on file  . Marital Status: Not on file  Intimate Partner Violence:   . Fear of Current or Ex-Partner: Not on file  . Emotionally Abused: Not on file  . Physically Abused: Not on file  . Sexually Abused: Not on file   Family History  Problem Relation Age of Onset  . Depression Mother   . Hypertension Mother   . Depression Father   . Hypertension Father   . Hyperlipidemia Father   . Heart disease Father   . Heart attack Father     Review of Systems  Constitutional: Positive for unexpected weight change. Negative for  fever.  HENT: Negative for congestion, dental problem, ear pain, nosebleeds, postnasal drip, rhinorrhea, sinus pressure, sneezing, sore throat and trouble swallowing.   Eyes: Negative for redness and itching.  Respiratory: Positive for wheezing. Negative for cough, chest tightness and shortness of breath.   Cardiovascular: Negative for palpitations and leg swelling.  Gastrointestinal: Negative for nausea and vomiting.  Genitourinary: Negative for dysuria.  Musculoskeletal: Negative for joint swelling.  Skin: Negative for rash.  Allergic/Immunologic: Negative.  Negative for environmental allergies, food allergies and immunocompromised state.  Neurological: Positive for headaches.  Hematological: Does not bruise/bleed easily.  Psychiatric/Behavioral: Negative for dysphoric mood. The  patient is not nervous/anxious.        Objective:   Physical Exam Constitutional:      Appearance: She is obese.  HENT:     Head: Normocephalic and atraumatic.     Nose: Nose normal.     Mouth/Throat:     Comments: Crowded oropharynx, Mallampati 4 Musculoskeletal:     Cervical back: No muscular tenderness.  Neurological:     Mental Status: She is alert.   Unremarkable exam  Results of the Epworth flowsheet 01/27/2019  Sitting and reading 3  Watching TV 2  Sitting, inactive in a public place (e.g. a theatre or a meeting) 1  As a passenger in a car for an hour without a break 0  Lying down to rest in the afternoon when circumstances permit 3  Sitting and talking to someone 0  Sitting quietly after a lunch without alcohol 1  In a car, while stopped for a few minutes in traffic 0  Total score 10      Assessment:     Severe obstructive sleep apnea -Still having some difficulty tolerating CPAP  Obesity -She is working hard on weight loss efforts  Nonrestorative sleep -Sleep apnea is not well treated at present  Polycystic ovarian syndrome    Plan:     Pathophysiology of sleep disordered breathing discussed with the patient  Treatment options for sleep disordered breathing discussed with patient  Initiate Lunesta 2 mg nightly, this should help CPAP tolerance  I will see her back in 4 weeks  Encouraged to bring smart card in for compliance monitoring  Encouraged to continue weight loss efforts

## 2019-04-21 NOTE — Patient Instructions (Signed)
Severe obstructive sleep apnea  Continue efforts at CPAP use every night  Try and get about 6 to 8 hours of sleep as able  We will initiate Lunesta-use as discussed  I will see you in about 4 weeks  Bring your card in so that we can download data from it at next visit

## 2019-04-29 ENCOUNTER — Other Ambulatory Visit: Payer: Self-pay | Admitting: Family Medicine

## 2019-04-29 MED ORDER — METFORMIN HCL 1000 MG PO TABS: ORAL_TABLET | ORAL | 1 refills | Status: DC

## 2019-05-01 ENCOUNTER — Encounter: Payer: Self-pay | Admitting: Family Medicine

## 2019-05-06 ENCOUNTER — Ambulatory Visit: Payer: BC Managed Care – PPO | Admitting: Family Medicine

## 2019-05-13 ENCOUNTER — Other Ambulatory Visit: Payer: Self-pay

## 2019-05-13 ENCOUNTER — Encounter: Payer: Self-pay | Admitting: Family Medicine

## 2019-05-13 ENCOUNTER — Ambulatory Visit: Payer: BC Managed Care – PPO | Admitting: Family Medicine

## 2019-05-13 VITALS — BP 128/86 | HR 98 | Temp 95.6°F | Ht 64.0 in | Wt 212.0 lb

## 2019-05-13 DIAGNOSIS — E669 Obesity, unspecified: Secondary | ICD-10-CM

## 2019-05-13 DIAGNOSIS — F4323 Adjustment disorder with mixed anxiety and depressed mood: Secondary | ICD-10-CM | POA: Insufficient documentation

## 2019-05-13 DIAGNOSIS — E1169 Type 2 diabetes mellitus with other specified complication: Secondary | ICD-10-CM | POA: Diagnosis not present

## 2019-05-13 LAB — HEMOGLOBIN A1C: Hgb A1c MFr Bld: 6.6 % — ABNORMAL HIGH (ref 4.6–6.5)

## 2019-05-13 MED ORDER — ESCITALOPRAM OXALATE 20 MG PO TABS: 20.0000 mg | ORAL_TABLET | Freq: Every day | ORAL | 2 refills | Status: DC

## 2019-05-13 NOTE — Patient Instructions (Addendum)
Ask your human resources department for forms regarding FMLA. Let me know the dates you need.    Keep the diet clean and stay active.  Strong work on your weight loss.  Please consider counseling. Contact [phone] to schedule an appointment or inquire about cost/insurance coverage.  Give [place] 2-3 business days to get the results of your labs back.   Keep the diet clean and stay active.

## 2019-05-13 NOTE — Progress Notes (Signed)
Chief Complaint  Patient presents with  . Follow-up    depression    Subjective: Patient is a 44 y.o. female here for dm f/u.  Patient was seen for a physical 3 months ago and her A1c was 7.7.  She had been compliant with Metformin.  Ozempic was added.  Since then, her home readings have been low a hundreds or less than 100.  Denies any hypoglycemia.  She has lost around 8 pounds.  No chest pain or shortness of breath.  Her anxiety and depression has been getting worse.  Work is a Insurance claims handler.  She is being asked to do more than her 1st year and they are not being very understanding of her struggles.  She is currently taking Zoloft 75 mg daily.  She reports compliance and is not having adverse effects.  She is not currently following with a counselor.   ROS: Heart: Denies chest pain  Lungs: Denies SOB   Past Medical History:  Diagnosis Date  . Asthma   . Depression   . History of PCOS   . Hypertension     Objective: BP 128/86 (BP Location: Left Arm, Patient Position: Sitting, Cuff Size: Normal)   Pulse 98   Temp (!) 95.6 F (35.3 C) (Temporal)   Ht 5\' 4"  (1.626 m)   Wt 212 lb (96.2 kg)   SpO2 97%   BMI 36.39 kg/m  General: Awake, appears stated age Lungs:. No accessory muscle use Psych: Age appropriate judgment and insight, normal affect and mood, she did become tearful during the exam  Assessment and Plan: Diabetes mellitus type 2 in obese (HCC) - Plan: Semaglutide, 1 MG/DOSE, (OZEMPIC, 1 MG/DOSE,) 2 MG/1.5ML SOPN, Hemoglobin A1c  Situational mixed anxiety and depressive disorder - Plan: escitalopram (LEXAPRO) 20 MG tablet  1-continue Metformin and Ozempic.  Will consider adding SGLT2 inhibitor if still uncontrolled.  Sounds like she is doing much better though. 2-change Zoloft to Lexapro.  Counseling information provided.  She is considering taking time off from work to both reflect and improve her mental health.  She will drop off/send FMLA forms. F/u in 4  weeks.  The patient voiced understanding and agreement to the plan.  [place], DO 05/13/19  1:59 PM

## 2019-05-15 ENCOUNTER — Encounter: Payer: Self-pay | Admitting: Family Medicine

## 2019-05-16 ENCOUNTER — Telehealth: Payer: Self-pay | Admitting: Family Medicine

## 2019-05-16 NOTE — Telephone Encounter (Signed)
PCP completed FMLA for the patient. Faxed to number on form///received fax confirmation. Called //and sent a my chart message to inform.

## 2019-05-20 ENCOUNTER — Ambulatory Visit: Payer: BC Managed Care – PPO | Admitting: Pulmonary Disease

## 2019-05-23 ENCOUNTER — Ambulatory Visit: Payer: BC Managed Care – PPO | Admitting: Primary Care

## 2019-05-23 ENCOUNTER — Encounter: Payer: Self-pay | Admitting: Primary Care

## 2019-05-23 ENCOUNTER — Other Ambulatory Visit: Payer: Self-pay

## 2019-05-23 DIAGNOSIS — G4733 Obstructive sleep apnea (adult) (pediatric): Secondary | ICD-10-CM | POA: Diagnosis not present

## 2019-05-23 DIAGNOSIS — G479 Sleep disorder, unspecified: Secondary | ICD-10-CM

## 2019-05-23 DIAGNOSIS — Z9989 Dependence on other enabling machines and devices: Secondary | ICD-10-CM | POA: Diagnosis not present

## 2019-05-23 NOTE — Assessment & Plan Note (Signed)
-   Compliant with CPAP and reports benefit from use - Pressure 5-20cm h20 (5.6cm h20); AHI 3.1 - Encourage patient aim to wear 4-6 hours or more each night  - Advised not to drive if experiencing excessive daytime fatigue or somnolence - FU in 6 months or sooner if needed

## 2019-05-23 NOTE — Progress Notes (Signed)
@Patient  ID: , female    DOB: Jul 06, 1975, 44 y.o.   MRN: [id number]  Chief Complaint  Patient presents with  . Follow-up    Referring provider: [id number]*  HPI: 44 year old female, never smoked. PMH significant for OSA. Patient of Dr. 45, last seen on 04/21/19. Patient had difficult time getting used to CPAP. Started on Lunesta 2mg . Maintained on CPAP auto titrate 5-20cm h20.   05/23/2019 Patient presents today for 4 week follow-up. She is doing well, overall very compliant with CPAP use. Reports benefit from use, states that she is not exhausted and has more energy during the day. Currently dealing with some sinus congestion and allergy symptoms. Occasionally will take her mask off while sleeping. She only takes when she can get a full 8 hours of sleep. Typically goes to bed around 9/10pm and wakes up at 4am. She is currently in school and trying to switch jobs. 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   [facility] Download 04/23/19-05/22/19: Usage - 26/30 days used Average hours (when used)- 3 hours 6 mins Pressure 5-20 (mean-5.6cm h20) AHI 3.1   Allergies  Allergen Reactions  . Lisinopril Cough    Side effect    Immunization History  Administered Date(s) Administered  . Influenza,inj,Quad PF,6+ Mos 01/21/2018, 01/31/2019  . Pneumococcal Polysaccharide-23 08/03/2017  . Td 01/21/2018    Past Medical History:  Diagnosis Date  . Asthma   . Depression   . History of PCOS   . Hypertension     Tobacco History: Social History   Tobacco Use  Smoking Status Former Smoker  . Packs/day: 0.50  . Years: 20.00  . Pack years: 10.00  . Types: Cigarettes  . Quit date: 04/28/2018  . Years since quitting: 1.0  Smokeless Tobacco Never Used   Counseling given: Not Answered   Outpatient Medications Prior to Visit  Medication Sig Dispense Refill  . aluminum chloride (DRYSOL) 20 % external solution Apply topically at bedtime. 35 mL 0  .  amLODipine-valsartan (EXFORGE) 10-160 MG tablet TAKE 1 TABLET BY MOUTH DAILY 90 tablet 2  . atorvastatin (LIPITOR) 40 MG tablet Take 1 tablet (40 mg total) by mouth daily. 90 tablet 3  . chlorthalidone (HYGROTON) 25 MG tablet TAKE 1 TABLET(25 MG) BY MOUTH DAILY 90 tablet 2  . escitalopram (LEXAPRO) 20 MG tablet Take 1 tablet (20 mg total) by mouth daily. 30 tablet 2  . etonogestrel-ethinyl estradiol (NUVARING) 0.12-0.015 MG/24HR vaginal ring Insert vaginally and leave in place for 3 consecutive weeks, then remove for 1 week. 3 each 3  . glucose blood (ONETOUCH VERIO) test strip Use daily to check blood sugar.  DX E11.9 100 each 3  . metFORMIN (GLUCOPHAGE) 1000 MG tablet TAKE 1 TABLET(1000 MG) BY MOUTH TWICE DAILY WITH A MEAL 180 tablet 1  . pantoprazole (PROTONIX) 40 MG tablet Take 1 tablet (40 mg total) by mouth daily. 90 tablet 1  . Semaglutide, 1 MG/DOSE, (OZEMPIC, 1 MG/DOSE,) 2 MG/1.5ML SOPN Inject 0.5 mg into the skin once a week. 4 pen 2   No facility-administered medications prior to visit.   Review of Systems  Review of Systems  Constitutional: Negative.   HENT: Positive for postnasal drip.   Respiratory: Negative for cough, chest tightness and wheezing.    Physical Exam  BP 128/80 (BP Location: Left Arm, Cuff Size: Large)   Pulse 90   Temp (!) 97.4 F (36.3 C) (Oral)   Ht 5\' 4"  (1.626 m)   Wt 213  lb 6.4 oz (96.8 kg)   SpO2 98%   BMI 36.63 kg/m  Physical Exam Constitutional:      General: She is not in acute distress.    Appearance: Normal appearance. She is not ill-appearing.  HENT:     Mouth/Throat:     Comments: Deferred d/t masking Cardiovascular:     Rate and Rhythm: Normal rate and regular rhythm.  Pulmonary:     Effort: Pulmonary effort is normal.     Breath sounds: Normal breath sounds.  Musculoskeletal:        General: Normal range of motion.  Skin:    General: Skin is warm and dry.  Neurological:     General: No focal deficit present.     Mental  Status: She is alert. Mental status is at baseline.  Psychiatric:        Mood and Affect: Mood normal.        Thought Content: Thought content normal.        Judgment: Judgment normal.      Lab Results:  CBC    Component Value Date/Time   WBC 13.1 (H) 02/17/2019 1359   RBC 4.42 02/17/2019 1359   HGB 12.2 02/17/2019 1359   HCT 37.8 02/17/2019 1359   PLT 499 (H) 02/17/2019 1359   MCV 85.5 02/17/2019 1359   MCH 27.6 02/17/2019 1359   MCHC 32.3 02/17/2019 1359   RDW 13.3 02/17/2019 1359   LYMPHSABS 2,646 02/17/2019 1359   MONOABS 0.6 02/17/2019 0737   EOSABS 537 (H) 02/17/2019 1359   BASOSABS 52 02/17/2019 1359    BMET    Component Value Date/Time   NA 137 02/17/2019 0737   K 3.8 02/17/2019 0737   CL 101 02/17/2019 0737   CO2 25 02/17/2019 0737   GLUCOSE 223 (H) 02/17/2019 0737   BUN 16 02/17/2019 0737   CREATININE 0.67 02/17/2019 0737   CREATININE 0.62 01/31/2019 1526   CALCIUM 9.5 02/17/2019 0737    BNP No results found for: BNP  ProBNP No results found for: PROBNP  Imaging: No results found.   Assessment & Plan:   Obstructive sleep apnea on CPAP - Compliant with CPAP and reports benefit from use - Pressure 5-20cm h20 (5.6cm h20); AHI 3.1 - Encourage patient aim to wear 4-6 hours or more each night  - Advised not to drive if experiencing excessive daytime fatigue or somnolence - FU in 6 months or sooner if needed  Sleep disturbance - Difficulty tolerating CPAP and falling asleep - Continue Lunesta 2mg  at night, only take when able to get 8 or more hours of sleep - If unable to take Lunesta, try melatonin 5-10mg     Martyn Ehrich, NP 05/23/2019

## 2019-05-23 NOTE — Assessment & Plan Note (Signed)
-   Difficulty tolerating CPAP and falling asleep - Continue Lunesta 2mg  at night, only take when able to get 8 or more hours of sleep - If unable to take Lunesta, try melatonin 5-10mg

## 2019-05-23 NOTE — Patient Instructions (Addendum)
Insomnia: Nights you do not take lunesta you can take melatonin 5-10mg  at bedtime   CPAP: Wear CPAP every night for minimum of 4-6 hours  Do not drive if you experience excessive daytime fatigue or somnolence    Follow-up: 6 months with Dr. Wynona Neat or sooner if needed    Sleep Apnea Sleep apnea affects breathing during sleep. It causes breathing to stop for a short time or to become shallow. It can also increase the risk of:  Heart attack.  Stroke.  Being very overweight (obese).  Diabetes.  Heart failure.  Irregular heartbeat. The goal of treatment is to help you breathe normally again. What are the causes? There are three kinds of sleep apnea:  Obstructive sleep apnea. This is caused by a blocked or collapsed airway.  Central sleep apnea. This happens when the brain does not send the right signals to the muscles that control breathing.  Mixed sleep apnea. This is a combination of obstructive and central sleep apnea. The most common cause of this condition is a collapsed or blocked airway. This can happen if:  Your throat muscles are too relaxed.  Your tongue and tonsils are too large.  You are overweight.  Your airway is too small. What increases the risk?  Being overweight.  Smoking.  Having a small airway.  Being older.  Being female.  Drinking alcohol.  Taking medicines to calm yourself (sedatives or tranquilizers).  Having family members with the condition. What are the signs or symptoms?  Trouble staying asleep.  Being sleepy or tired during the day.  Getting angry a lot.  Loud snoring.  Headaches in the morning.  Not being able to focus your mind (concentrate).  Forgetting things.  Less interest in sex.  Mood swings.  Personality changes.  Feelings of sadness (depression).  Waking up a lot during the night to pee (urinate).  Dry mouth.  Sore throat. How is this diagnosed?  Your medical history.  A physical exam.  A  test that is done when you are sleeping (sleep study). The test is most often done in a sleep lab but may also be done at home. How is this treated?   Sleeping on your side.  Using a medicine to get rid of mucus in your nose (decongestant).  Avoiding the use of alcohol, medicines to help you relax, or certain pain medicines (narcotics).  Losing weight, if needed.  Changing your diet.  Not smoking.  Using a machine to open your airway while you sleep, such as: ? An oral appliance. This is a mouthpiece that shifts your lower jaw forward. ? A CPAP device. This device blows air through a mask when you breathe out (exhale). ? An EPAP device. This has valves that you put in each nostril. ? A BPAP device. This device blows air through a mask when you breathe in (inhale) and breathe out.  Having surgery if other treatments do not work. It is important to get treatment for sleep apnea. Without treatment, it can lead to:  High blood pressure.  Coronary artery disease.  In men, not being able to have an erection (impotence).  Reduced thinking ability. Follow these instructions at home: Lifestyle  Make changes that your doctor recommends.  Eat a healthy diet.  Lose weight if needed.  Avoid alcohol, medicines to help you relax, and some pain medicines.  Do not use any products that contain nicotine or tobacco, such as cigarettes, e-cigarettes, and chewing tobacco. If you need help quitting,  ask your doctor. General instructions  Take over-the-counter and prescription medicines only as told by your doctor.  If you were given a machine to use while you sleep, use it only as told by your doctor.  If you are having surgery, make sure to tell your doctor you have sleep apnea. You may need to bring your device with you.  Keep all follow-up visits as told by your doctor. This is important. Contact a doctor if:  The machine that you were given to use during sleep bothers you or does  not seem to be working.  You do not get better.  You get worse. Get help right away if:  Your chest hurts.  You have trouble breathing in enough air.  You have an uncomfortable feeling in your back, arms, or stomach.  You have trouble talking.  One side of your body feels weak.  A part of your face is hanging down. These symptoms may be an emergency. Do not wait to see if the symptoms will go away. Get medical help right away. Call your local emergency services (911 in the [place] Do not drive yourself to the hospital. Summary  This condition affects breathing during sleep.  The most common cause is a collapsed or blocked airway.  The goal of treatment is to help you breathe normally while you sleep. This information is not intended to replace advice given to you by your health care provider. Make sure you discuss any questions you have with your health care provider. Document Revised: 01/25/2018 Document Reviewed: 12/04/2017 Elsevier Patient Education  [place].  CPAP and BPAP Information CPAP and BPAP are methods of helping a person breathe with the use of air pressure. CPAP stands for "continuous positive airway pressure." BPAP stands for "bi-level positive airway pressure." In both methods, air is blown through your nose or mouth and into your air passages to help you breathe well. CPAP and BPAP use different amounts of pressure to blow air. With CPAP, the amount of pressure stays the same while you breathe in and out. With BPAP, the amount of pressure is increased when you breathe in (inhale) so that you can take larger breaths. Your health care provider will recommend whether CPAP or BPAP would be more helpful for you. Why are CPAP and BPAP treatments used? CPAP or BPAP can be helpful if you have:  Sleep apnea.  Chronic obstructive pulmonary disease (COPD).  Heart failure.  Medical conditions that weaken the muscles of the chest including muscular dystrophy,  or neurological diseases such as amyotrophic lateral sclerosis (ALS).  Other problems that cause breathing to be weak, abnormal, or difficult. CPAP is most commonly used for obstructive sleep apnea (OSA) to keep the airways from collapsing when the muscles relax during sleep. How is CPAP or BPAP administered? Both CPAP and BPAP are provided by a small machine with a flexible plastic tube that attaches to a plastic mask. You wear the mask. Air is blown through the mask into your nose or mouth. The amount of pressure that is used to blow the air can be adjusted on the machine. Your health care provider will determine the pressure setting that should be used based on your individual needs. When should CPAP or BPAP be used? In most cases, the mask only needs to be worn during sleep. Generally, the mask needs to be worn throughout the night and during any daytime naps. People with certain medical conditions may also need to wear the mask  at other times when they are awake. Follow instructions from your health care provider about when to use the machine. What are some tips for using the mask?   Because the mask needs to be snug, some people feel trapped or closed-in (claustrophobic) when first using the mask. If you feel this way, you may need to get used to the mask. One way to do this is by holding the mask loosely over your nose or mouth and then gradually applying the mask more snugly. You can also gradually increase the amount of time that you use the mask.  Masks are available in various types and sizes. Some fit over your mouth and nose while others fit over just your nose. If your mask does not fit well, talk with your health care provider about getting a different one.  If you are using a mask that fits over your nose and you tend to breathe through your mouth, a chin strap may be applied to help keep your mouth closed.  The CPAP and BPAP machines have alarms that may sound if the mask comes off  or develops a leak.  If you have trouble with the mask, it is very important that you talk with your health care provider about finding a way to make the mask easier to tolerate. Do not stop using the mask. Stopping the use of the mask could have a negative impact on your health. What are some tips for using the machine?  Place your CPAP or BPAP machine on a secure table or stand near an electrical outlet.  Know where the on/off switch is located on the machine.  Follow instructions from your health care provider about how to set the pressure on your machine and when you should use it.  Do not eat or drink while the CPAP or BPAP machine is on. Food or fluids could get pushed into your lungs by the pressure of the CPAP or BPAP.  Do not smoke. Tobacco smoke residue can damage the machine.  For home use, CPAP and BPAP machines can be rented or purchased through home health care companies. Many different brands of machines are available. Renting a machine before purchasing may help you find out which particular machine works well for you.  Keep the CPAP or BPAP machine and attachments clean. Ask your health care provider for specific instructions. Get help right away if:  You have redness or open areas around your nose or mouth where the mask fits.  You have trouble using the CPAP or BPAP machine.  You cannot tolerate wearing the CPAP or BPAP mask.  You have pain, discomfort, and bloating in your abdomen. Summary  CPAP and BPAP are methods of helping a person breathe with the use of air pressure.  Both CPAP and BPAP are provided by a small machine with a flexible plastic tube that attaches to a plastic mask.  If you have trouble with the mask, it is very important that you talk with your health care provider about finding a way to make the mask easier to tolerate. This information is not intended to replace advice given to you by your health care provider. Make sure you discuss any  questions you have with your health care provider. Document Revised: 07/31/2018 Document Reviewed: 02/28/2016 Elsevier Patient Education  2020 [employer].

## 2019-06-03 ENCOUNTER — Ambulatory Visit: Payer: BC Managed Care – PPO | Admitting: Pulmonary Disease

## 2019-06-13 ENCOUNTER — Ambulatory Visit: Payer: BC Managed Care – PPO | Admitting: Family Medicine

## 2019-07-28 ENCOUNTER — Other Ambulatory Visit: Payer: Self-pay | Admitting: Family Medicine

## 2019-07-28 DIAGNOSIS — K219 Gastro-esophageal reflux disease without esophagitis: Secondary | ICD-10-CM

## 2019-07-28 DIAGNOSIS — E1169 Type 2 diabetes mellitus with other specified complication: Secondary | ICD-10-CM

## 2019-07-28 MED ORDER — OZEMPIC (1 MG/DOSE) 2 MG/1.5ML ~~LOC~~ SOPN: 0.5000 mg | PEN_INJECTOR | SUBCUTANEOUS | 2 refills | Status: DC

## 2019-07-28 MED ORDER — PANTOPRAZOLE SODIUM 40 MG PO TBEC: 40.0000 mg | DELAYED_RELEASE_TABLET | Freq: Every day | ORAL | 1 refills | Status: DC

## 2019-08-01 ENCOUNTER — Ambulatory Visit: Payer: BC Managed Care – PPO | Admitting: Family Medicine

## 2019-09-03 ENCOUNTER — Telehealth: Payer: Self-pay | Admitting: Family Medicine

## 2019-09-03 NOTE — Telephone Encounter (Signed)
[name]  Call Back # [phone]  Subject: Medication  Re Fill   Patient called to r/s appointment due to provider schedule. Patient is requesting a re-fill on all medications; please for a months supply while patient can get in to see provider.  Please Advise

## 2019-09-03 NOTE — Telephone Encounter (Signed)
Called left message to call back to confirm which meds need refilling and to confirm pharmacy to send to.

## 2019-09-03 NOTE — Telephone Encounter (Signed)
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 In June. She is living out of her truck and will not be back home until June. She will just have to wait until seeing PCP in June to get her refills.

## 2019-09-10 ENCOUNTER — Other Ambulatory Visit: Payer: BC Managed Care – PPO

## 2019-09-15 ENCOUNTER — Ambulatory Visit: Payer: BC Managed Care – PPO | Admitting: Family Medicine

## 2019-09-29 ENCOUNTER — Telehealth: Payer: Self-pay | Admitting: Family Medicine

## 2019-09-29 ENCOUNTER — Encounter: Payer: Self-pay | Admitting: Family Medicine

## 2019-09-29 ENCOUNTER — Other Ambulatory Visit: Payer: Self-pay | Admitting: Family Medicine

## 2019-09-29 ENCOUNTER — Other Ambulatory Visit: Payer: Self-pay

## 2019-09-29 ENCOUNTER — Ambulatory Visit: Payer: BC Managed Care – PPO | Admitting: Family Medicine

## 2019-09-29 VITALS — BP 122/86 | HR 105 | Temp 96.9°F | Ht 64.0 in | Wt 200.5 lb

## 2019-09-29 DIAGNOSIS — I10 Essential (primary) hypertension: Secondary | ICD-10-CM

## 2019-09-29 DIAGNOSIS — E669 Obesity, unspecified: Secondary | ICD-10-CM

## 2019-09-29 DIAGNOSIS — F411 Generalized anxiety disorder: Secondary | ICD-10-CM | POA: Diagnosis not present

## 2019-09-29 DIAGNOSIS — E1169 Type 2 diabetes mellitus with other specified complication: Secondary | ICD-10-CM | POA: Diagnosis not present

## 2019-09-29 DIAGNOSIS — E7849 Other hyperlipidemia: Secondary | ICD-10-CM

## 2019-09-29 LAB — COMPREHENSIVE METABOLIC PANEL
ALT: 16 U/L (ref 0–35)
AST: 13 U/L (ref 0–37)
Albumin: 3.9 g/dL (ref 3.5–5.2)
Alkaline Phosphatase: 78 U/L (ref 39–117)
BUN: 14 mg/dL (ref 6–23)
CO2: 27 mEq/L (ref 19–32)
Calcium: 9.7 mg/dL (ref 8.4–10.5)
Chloride: 103 mEq/L (ref 96–112)
Creatinine, Ser: 0.61 mg/dL (ref 0.40–1.20)
GFR: 106.98 mL/min (ref >60.00)
Glucose, Bld: 97 mg/dL (ref 70–99)
Potassium: 3.5 mEq/L (ref 3.5–5.1)
Sodium: 137 mEq/L (ref 135–145)
Total Bilirubin: 0.3 mg/dL (ref 0.2–1.2)
Total Protein: 7.1 g/dL (ref 6.0–8.3)

## 2019-09-29 LAB — HEMOGLOBIN A1C: Hgb A1c MFr Bld: 6.3 % (ref 4.6–6.5)

## 2019-09-29 LAB — LIPID PANEL
Cholesterol: 169 mg/dL (ref 0–200)
HDL: 43.1 mg/dL (ref >39.00)
NonHDL: 125.58
Total CHOL/HDL Ratio: 4
Triglycerides: 232 mg/dL — ABNORMAL HIGH (ref 0.0–149.0)
VLDL: 46.4 mg/dL — ABNORMAL HIGH (ref 0.0–40.0)

## 2019-09-29 LAB — LDL CHOLESTEROL, DIRECT: Direct LDL: 100 mg/dL

## 2019-09-29 MED ORDER — OZEMPIC (0.25 OR 0.5 MG/DOSE) 2 MG/1.5ML ~~LOC~~ SOPN: 0.5000 mg | PEN_INJECTOR | SUBCUTANEOUS | 4 refills | Status: DC

## 2019-09-29 MED ORDER — BROMI-LOTION EX LOTN: TOPICAL_LOTION | CUTANEOUS | 2 refills | Status: DC

## 2019-09-29 MED ORDER — ESCITALOPRAM OXALATE 10 MG PO TABS: 10.0000 mg | ORAL_TABLET | Freq: Every day | ORAL | 1 refills | Status: DC

## 2019-09-29 MED ORDER — AMLODIPINE BESYLATE-VALSARTAN 10-320 MG PO TABS: 1.0000 | ORAL_TABLET | Freq: Every day | ORAL | 5 refills | Status: DC

## 2019-09-29 NOTE — Telephone Encounter (Signed)
Caller: Itza Call back phone number: [phone]  Patient states Dr. Carmelia Roller was supposed to prescribe her something for her sweaty hands. She does not see it on her AVS.

## 2019-09-29 NOTE — Patient Instructions (Signed)
Give [place] 2-3 business days to get the results of your labs back.   Keep the diet clean and stay active.  Get your covid vaccination please!  Let us know if you need anything.

## 2019-09-29 NOTE — Telephone Encounter (Signed)
We d/c'd it.

## 2019-09-29 NOTE — Telephone Encounter (Signed)
Pharmacy informed.

## 2019-09-29 NOTE — Telephone Encounter (Signed)
Pharmacy requesting refill on Chlorthalidone 25 mg tablets. Medication not on current list?

## 2019-09-29 NOTE — Progress Notes (Signed)
Subjective:   Chief Complaint  Patient presents with  . Follow-up    Jasmine Higgins is a 44 y.o. female here for follow-up of diabetes.   Ahmoni's self monitored glucose range is low 100's.  Patient denies hypoglycemic reactions. She checks her glucose levels intermittently. Patient does not require insulin.   Medications include: Metformin 1000 mg bid, Ozempic 0.5 mg weekly Exercise: walking  Hypertension Patient presents for hypertension follow up. She does monitor home blood pressures. Blood pressures run high on her device.  She is compliant with medications- Exforge 10-160 mg/d, chlorthalidone 25 mg/d. Jasmine Higgins Patient has these side effects of medication: none Diet/exercise as above.   GAD Taking Lexapro 20 mg/d. And reports compliance. No AE's. Feeling better since changing jobs. Would like to wean down.   Past Medical History:  Diagnosis Date  . Asthma   . Depression   . History of PCOS   . Hypertension      Related testing: Date of retinal exam: done Pneumovax: done   Objective:  BP 122/86 (BP Location: Left Arm, Patient Position: Sitting, Cuff Size: Normal)   Pulse (!) 105   Temp (!) 96.9 F (36.1 C) (Temporal)   Ht 5\' 4"  (1.626 m)   Wt 200 lb 8 oz (90.9 kg)   SpO2 98%   BMI 34.42 kg/m  General:  Well developed, well nourished, in no apparent distress Lungs:  CTAB, no access msc use Cardio:  RRR, no bruits, no LE edema Psych: Age appropriate judgment and insight  Assessment:   Diabetes mellitus type 2 in obese (HCC) - Plan: Comprehensive metabolic panel, Hemoglobin A1c, Lipid panel  Essential hypertension  GAD (generalized anxiety disorder)   Plan:   1- Cont meds, may remove Metformin. we discussed Ozempic being the ideal diabetic medication to remove, but she would like to leave it and continue the GLP-1. Counseled on diet and exercise. 2- Stop Chlorthalidone, increase dosage of Exforge.  3- Wean down Lexapro to 10 mg/d. 5 mg/d for refill and then  stop. F/u in 3 mo. The patient voiced understanding and agreement to the plan.  [place], DO 09/29/19 1:27 PM

## 2019-11-03 ENCOUNTER — Other Ambulatory Visit: Payer: Self-pay

## 2019-11-03 ENCOUNTER — Other Ambulatory Visit (INDEPENDENT_AMBULATORY_CARE_PROVIDER_SITE_OTHER): Payer: BC Managed Care – PPO

## 2019-11-03 DIAGNOSIS — E7849 Other hyperlipidemia: Secondary | ICD-10-CM

## 2019-11-03 LAB — LIPID PANEL
Cholesterol: 134 mg/dL (ref 0–200)
HDL: 46.9 mg/dL (ref >39.00)
NonHDL: 87.12
Total CHOL/HDL Ratio: 3
Triglycerides: 213 mg/dL — ABNORMAL HIGH (ref 0.0–149.0)
VLDL: 42.6 mg/dL — ABNORMAL HIGH (ref 0.0–40.0)

## 2019-11-03 LAB — LDL CHOLESTEROL, DIRECT: Direct LDL: 64 mg/dL

## 2019-11-04 ENCOUNTER — Other Ambulatory Visit: Payer: Self-pay | Admitting: Family Medicine

## 2019-11-04 DIAGNOSIS — E7849 Other hyperlipidemia: Secondary | ICD-10-CM

## 2019-11-04 MED ORDER — FENOFIBRATE 48 MG PO TABS: 48.0000 mg | ORAL_TABLET | Freq: Every day | ORAL | 3 refills | Status: DC

## 2019-11-18 ENCOUNTER — Other Ambulatory Visit: Payer: Self-pay | Admitting: Family Medicine

## 2019-11-18 DIAGNOSIS — E669 Obesity, unspecified: Secondary | ICD-10-CM

## 2019-11-18 MED ORDER — ATORVASTATIN CALCIUM 40 MG PO TABS: 40.0000 mg | ORAL_TABLET | Freq: Every day | ORAL | 3 refills | Status: DC

## 2019-12-24 NOTE — Addendum Note (Signed)
Addended by: Mervin Kung A on: 12/24/2019 02:02 PM   Modules accepted: Orders

## 2019-12-26 ENCOUNTER — Other Ambulatory Visit (INDEPENDENT_AMBULATORY_CARE_PROVIDER_SITE_OTHER): Payer: BC Managed Care – PPO

## 2019-12-26 ENCOUNTER — Other Ambulatory Visit: Payer: Self-pay

## 2019-12-26 DIAGNOSIS — E7849 Other hyperlipidemia: Secondary | ICD-10-CM

## 2019-12-27 LAB — LIPID PANEL
Cholesterol: 145 mg/dL (ref <200)
HDL: 58 mg/dL (ref >=50)
LDL Cholesterol (Calc): 71 mg/dL (calc)
Non-HDL Cholesterol (Calc): 87 mg/dL (calc) (ref <130)
Total CHOL/HDL Ratio: 2.5 (calc) (ref <5.0)
Triglycerides: 77 mg/dL (ref <150)

## 2019-12-27 LAB — HEPATIC FUNCTION PANEL
AG Ratio: 1.2 (calc) (ref 1.0–2.5)
ALT: 9 U/L (ref 6–29)
AST: 12 U/L (ref 10–30)
Albumin: 3.9 g/dL (ref 3.6–5.1)
Alkaline phosphatase (APISO): 59 U/L (ref 31–125)
Bilirubin, Direct: 0.1 mg/dL (ref 0.0–0.2)
Globulin: 3.2 g/dL (calc) (ref 1.9–3.7)
Indirect Bilirubin: 0.2 mg/dL (calc) (ref 0.2–1.2)
Total Bilirubin: 0.3 mg/dL (ref 0.2–1.2)
Total Protein: 7.1 g/dL (ref 6.1–8.1)

## 2020-03-29 ENCOUNTER — Other Ambulatory Visit: Payer: Self-pay | Admitting: Family Medicine

## 2020-04-03 ENCOUNTER — Other Ambulatory Visit: Payer: Self-pay | Admitting: Family Medicine

## 2020-04-03 DIAGNOSIS — K219 Gastro-esophageal reflux disease without esophagitis: Secondary | ICD-10-CM

## 2020-04-12 ENCOUNTER — Other Ambulatory Visit: Payer: Self-pay | Admitting: Family Medicine

## 2020-04-12 MED ORDER — PIOGLITAZONE HCL 30 MG PO TABS: 30.0000 mg | ORAL_TABLET | Freq: Every day | ORAL | 3 refills | Status: DC

## 2020-04-13 MED ORDER — NORGESTIMATE-ETH ESTRADIOL 0.25-35 MG-MCG PO TABS: 1.0000 | ORAL_TABLET | Freq: Every day | ORAL | 11 refills | Status: DC

## 2020-06-09 ENCOUNTER — Telehealth: Payer: Self-pay | Admitting: *Deleted

## 2020-06-09 NOTE — Telephone Encounter (Signed)
Spoke with patient about follow up appointment.  She stated that she was on the road and will not be back til April.  She will call and make an appointment as soon as she can.

## 2020-07-02 ENCOUNTER — Other Ambulatory Visit: Payer: Self-pay | Admitting: Family Medicine

## 2020-08-27 ENCOUNTER — Ambulatory Visit: Payer: 59 | Admitting: Family Medicine

## 2020-08-27 ENCOUNTER — Other Ambulatory Visit: Payer: Self-pay | Admitting: Family Medicine

## 2020-08-27 ENCOUNTER — Encounter: Payer: Self-pay | Admitting: Family Medicine

## 2020-08-27 ENCOUNTER — Other Ambulatory Visit: Payer: Self-pay

## 2020-08-27 VITALS — BP 152/100 | HR 86 | Temp 98.5°F | Ht 64.0 in | Wt 215.2 lb

## 2020-08-27 DIAGNOSIS — E669 Obesity, unspecified: Secondary | ICD-10-CM

## 2020-08-27 DIAGNOSIS — E7849 Other hyperlipidemia: Secondary | ICD-10-CM | POA: Diagnosis not present

## 2020-08-27 DIAGNOSIS — I10 Essential (primary) hypertension: Secondary | ICD-10-CM | POA: Diagnosis not present

## 2020-08-27 DIAGNOSIS — J302 Other seasonal allergic rhinitis: Secondary | ICD-10-CM

## 2020-08-27 DIAGNOSIS — E1169 Type 2 diabetes mellitus with other specified complication: Secondary | ICD-10-CM | POA: Diagnosis not present

## 2020-08-27 DIAGNOSIS — Z1159 Encounter for screening for other viral diseases: Secondary | ICD-10-CM

## 2020-08-27 LAB — LIPID PANEL
Cholesterol: 183 mg/dL (ref 0–200)
HDL: 51.5 mg/dL (ref >39.00)
LDL Cholesterol: 115 mg/dL — ABNORMAL HIGH (ref 0–99)
NonHDL: 131.17
Total CHOL/HDL Ratio: 4
Triglycerides: 81 mg/dL (ref 0.0–149.0)
VLDL: 16.2 mg/dL (ref 0.0–40.0)

## 2020-08-27 LAB — CBC
HCT: 40.1 % (ref 36.0–46.0)
Hemoglobin: 13.2 g/dL (ref 12.0–15.0)
MCHC: 33 g/dL (ref 30.0–36.0)
MCV: 84.2 fl (ref 78.0–100.0)
Platelets: 466 10*3/uL — ABNORMAL HIGH (ref 150.0–400.0)
RBC: 4.76 Mil/uL (ref 3.87–5.11)
RDW: 14.8 % (ref 11.5–15.5)
WBC: 11.6 10*3/uL — ABNORMAL HIGH (ref 4.0–10.5)

## 2020-08-27 LAB — COMPREHENSIVE METABOLIC PANEL
ALT: 25 U/L (ref 0–35)
AST: 13 U/L (ref 0–37)
Albumin: 3.8 g/dL (ref 3.5–5.2)
Alkaline Phosphatase: 68 U/L (ref 39–117)
BUN: 20 mg/dL (ref 6–23)
CO2: 28 mEq/L (ref 19–32)
Calcium: 9.3 mg/dL (ref 8.4–10.5)
Chloride: 102 mEq/L (ref 96–112)
Creatinine, Ser: 0.63 mg/dL (ref 0.40–1.20)
GFR: 108.18 mL/min (ref >60.00)
Glucose, Bld: 163 mg/dL — ABNORMAL HIGH (ref 70–99)
Potassium: 4.5 mEq/L (ref 3.5–5.1)
Sodium: 138 mEq/L (ref 135–145)
Total Bilirubin: 0.2 mg/dL (ref 0.2–1.2)
Total Protein: 7 g/dL (ref 6.0–8.3)

## 2020-08-27 LAB — HEMOGLOBIN A1C: Hgb A1c MFr Bld: 6.9 % — ABNORMAL HIGH (ref 4.6–6.5)

## 2020-08-27 LAB — MICROALBUMIN / CREATININE URINE RATIO
Creatinine,U: 72.2 mg/dL
Microalb Creat Ratio: 5.6 mg/g (ref 0.0–30.0)
Microalb, Ur: 4 mg/dL — ABNORMAL HIGH (ref 0.0–1.9)

## 2020-08-27 MED ORDER — ATORVASTATIN CALCIUM 40 MG PO TABS: 40.0000 mg | ORAL_TABLET | Freq: Every day | ORAL | 3 refills | Status: DC

## 2020-08-27 MED ORDER — METFORMIN HCL 1000 MG PO TABS: ORAL_TABLET | ORAL | 2 refills | Status: DC

## 2020-08-27 MED ORDER — PIOGLITAZONE HCL 30 MG PO TABS: 30.0000 mg | ORAL_TABLET | Freq: Every day | ORAL | 1 refills | Status: DC

## 2020-08-27 MED ORDER — MONTELUKAST SODIUM 10 MG PO TABS: 10.0000 mg | ORAL_TABLET | Freq: Every day | ORAL | 2 refills | Status: DC

## 2020-08-27 MED ORDER — FENOFIBRATE 48 MG PO TABS: 48.0000 mg | ORAL_TABLET | Freq: Every day | ORAL | 2 refills | Status: DC

## 2020-08-27 MED ORDER — ALBUTEROL SULFATE HFA 108 (90 BASE) MCG/ACT IN AERS: 1.0000 | INHALATION_SPRAY | Freq: Four times a day (QID) | RESPIRATORY_TRACT | 2 refills | Status: DC | PRN

## 2020-08-27 MED ORDER — AMLODIPINE BESYLATE-VALSARTAN 10-320 MG PO TABS: 1.0000 | ORAL_TABLET | Freq: Every day | ORAL | 5 refills | Status: DC

## 2020-08-27 MED ORDER — ALBUTEROL SULFATE HFA 108 (90 BASE) MCG/ACT IN AERS: 1.0000 | INHALATION_SPRAY | Freq: Four times a day (QID) | RESPIRATORY_TRACT | 2 refills | Status: AC | PRN

## 2020-08-27 NOTE — Patient Instructions (Addendum)
Give [place] 2-3 business days to get the results of your labs back.   Keep the diet clean and stay active.  Call your eye doctor for an appointment.   Don't get pregnant on these medicines.   Let us know if you need anything.

## 2020-08-27 NOTE — Progress Notes (Signed)
Subjective:   Chief Complaint  Patient presents with  . Follow-up    Patient not taking any medications.  Is out    Jasmine Higgins is a 45 y.o. female here for follow-up of diabetes.   Stephenie does not check her sugars routinely.  Patient does not require insulin.   Medications include: has not been taking Diet is fair.  Exercise: walking No CP or SOB.   Hypertension Patient presents for hypertension follow up. She does not monitor home blood pressures. She is not compliant with medications as she ran out. Diet/exercise as above.   Allergies are flaring. Just restarted INCS, Zyrtec. Having some wheezing, needs refill on albuterol. No fevers or sob.   Past Medical History:  Diagnosis Date  . Asthma   . Depression   . History of PCOS   . Hypertension      Related testing: Retinal exam: Due Pneumovax: done  Objective:  BP (!) 152/100 (BP Location: Left Arm, Patient Position: Sitting, Cuff Size: Large)   Pulse 86   Temp 98.5 F (36.9 C) (Oral)   Ht 5\' 4"  (1.626 m)   Wt 215 lb 4 oz (97.6 kg)   SpO2 94%   BMI 36.95 kg/m  General:  Well developed, well nourished, in no apparent distress Skin:  Warm, no pallor or diaphoresis Head:  Normocephalic, atraumatic Eyes:  Pupils equal and round, sclera anicteric without injection  Lungs:  CTAB, no access msc use Cardio:  RRR, no bruits, no LE edema Musculoskeletal:  Symmetrical muscle groups noted without atrophy or deformity Neuro:  Sensation intact to pinprick on feet Psych: Age appropriate judgment and insight  Assessment:   Diabetes mellitus type 2 in obese (HCC) - Plan: Comprehensive metabolic panel, CBC, Lipid panel, Hemoglobin A1c, Microalbumin / creatinine urine ratio, pioglitazone (ACTOS) 30 MG tablet, metFORMIN (GLUCOPHAGE) 1000 MG tablet, atorvastatin (LIPITOR) 40 MG tablet, DISCONTINUED: atorvastatin (LIPITOR) 40 MG tablet  Essential hypertension - Plan: amLODipine-valsartan (EXFORGE) 10-320 MG tablet  Seasonal  allergies - Plan: montelukast (SINGULAIR) 10 MG tablet  Other hyperlipidemia - Plan: fenofibrate (TRICOR) 48 MG tablet, atorvastatin (LIPITOR) 40 MG tablet  Encounter for hepatitis C screening test for low risk patient   Plan:   1. Ck labs toda. Restart metformin 1000 mg bid, Actos 30 mg/d. Try to ck sugars intermittently. Printed rx's for today as she travels for work. Counseled on diet and exercise. 2. Restart Exforge. She stopped taking birth control, advised not to get pregnant on these medications nad let me know if/when she does plan on doing so. She voiced understanding to this. 3. Start Singulair, cont INCS, Zyrtec. 4. Cont Tricor 48 mg/d, Lipitor 40 mg/d.  F/u in 1 mo. The patient voiced understanding and agreement to the plan.  [place], DO 08/27/20 7:59 AM

## 2020-08-30 LAB — HEPATITIS C ANTIBODY
Hepatitis C Ab: NONREACTIVE
SIGNAL TO CUT-OFF: 0 (ref <1.00)

## 2020-09-06 ENCOUNTER — Ambulatory Visit: Payer: BC Managed Care – PPO | Admitting: Family Medicine

## 2020-09-29 ENCOUNTER — Other Ambulatory Visit: Payer: Self-pay

## 2020-09-29 ENCOUNTER — Telehealth: Payer: 59 | Admitting: Family Medicine

## 2020-12-29 ENCOUNTER — Other Ambulatory Visit: Payer: Self-pay | Admitting: Family Medicine

## 2020-12-29 DIAGNOSIS — E7849 Other hyperlipidemia: Secondary | ICD-10-CM

## 2020-12-29 DIAGNOSIS — E1169 Type 2 diabetes mellitus with other specified complication: Secondary | ICD-10-CM

## 2021-01-14 ENCOUNTER — Other Ambulatory Visit: Payer: Self-pay | Admitting: Family Medicine

## 2021-01-14 ENCOUNTER — Other Ambulatory Visit: Payer: Self-pay

## 2021-01-14 DIAGNOSIS — E1169 Type 2 diabetes mellitus with other specified complication: Secondary | ICD-10-CM

## 2021-01-14 MED ORDER — OZEMPIC (1 MG/DOSE) 2 MG/1.5ML ~~LOC~~ SOPN: 0.5000 mg | PEN_INJECTOR | SUBCUTANEOUS | 0 refills | Status: DC

## 2021-01-17 ENCOUNTER — Other Ambulatory Visit: Payer: Self-pay

## 2021-01-17 ENCOUNTER — Other Ambulatory Visit: Payer: Self-pay | Admitting: Family Medicine

## 2021-01-17 DIAGNOSIS — E1169 Type 2 diabetes mellitus with other specified complication: Secondary | ICD-10-CM

## 2021-01-17 DIAGNOSIS — E669 Obesity, unspecified: Secondary | ICD-10-CM

## 2021-01-17 MED ORDER — OZEMPIC (0.25 OR 0.5 MG/DOSE) 2 MG/1.5ML ~~LOC~~ SOPN: PEN_INJECTOR | SUBCUTANEOUS | 1 refills | Status: DC

## 2021-01-17 NOTE — Telephone Encounter (Signed)
Pharmacy had questions regarding Ozempic and resent as they requested. The patient is currently still on Actos. Does she need a followup?

## 2021-01-17 NOTE — Telephone Encounter (Signed)
She needs f/u in early Nov if not already scheduled. Ty.

## 2021-01-17 NOTE — Telephone Encounter (Signed)
Called the patient to inform. She is only home this week and goes back on the road and want return until the first of the year. Scheduled appt with PCP 01/18/21 at 11:15 AM .

## 2021-01-18 ENCOUNTER — Encounter: Payer: Self-pay | Admitting: Family Medicine

## 2021-01-18 ENCOUNTER — Ambulatory Visit: Payer: 59 | Admitting: Family Medicine

## 2021-01-18 ENCOUNTER — Other Ambulatory Visit: Payer: Self-pay

## 2021-01-18 VITALS — BP 128/78 | HR 102 | Temp 98.6°F | Ht 64.0 in | Wt 229.0 lb

## 2021-01-18 DIAGNOSIS — I10 Essential (primary) hypertension: Secondary | ICD-10-CM

## 2021-01-18 DIAGNOSIS — E669 Obesity, unspecified: Secondary | ICD-10-CM

## 2021-01-18 DIAGNOSIS — Z23 Encounter for immunization: Secondary | ICD-10-CM

## 2021-01-18 DIAGNOSIS — E1169 Type 2 diabetes mellitus with other specified complication: Secondary | ICD-10-CM | POA: Diagnosis not present

## 2021-01-18 LAB — COMPREHENSIVE METABOLIC PANEL
ALT: 31 U/L (ref 0–35)
AST: 18 U/L (ref 0–37)
Albumin: 4.2 g/dL (ref 3.5–5.2)
Alkaline Phosphatase: 66 U/L (ref 39–117)
BUN: 12 mg/dL (ref 6–23)
CO2: 26 mEq/L (ref 19–32)
Calcium: 9.5 mg/dL (ref 8.4–10.5)
Chloride: 102 mEq/L (ref 96–112)
Creatinine, Ser: 0.57 mg/dL (ref 0.40–1.20)
GFR: 110.51 mL/min (ref >60.00)
Glucose, Bld: 117 mg/dL — ABNORMAL HIGH (ref 70–99)
Potassium: 4.2 mEq/L (ref 3.5–5.1)
Sodium: 136 mEq/L (ref 135–145)
Total Bilirubin: 0.3 mg/dL (ref 0.2–1.2)
Total Protein: 7.5 g/dL (ref 6.0–8.3)

## 2021-01-18 LAB — LIPID PANEL
Cholesterol: 169 mg/dL (ref 0–200)
HDL: 55.7 mg/dL (ref >39.00)
LDL Cholesterol: 93 mg/dL (ref 0–99)
NonHDL: 113.6
Total CHOL/HDL Ratio: 3
Triglycerides: 104 mg/dL (ref 0.0–149.0)
VLDL: 20.8 mg/dL (ref 0.0–40.0)

## 2021-01-18 LAB — HEMOGLOBIN A1C: Hgb A1c MFr Bld: 6.6 % — ABNORMAL HIGH (ref 4.6–6.5)

## 2021-01-18 MED ORDER — OZEMPIC (0.25 OR 0.5 MG/DOSE) 2 MG/1.5ML ~~LOC~~ SOPN: PEN_INJECTOR | SUBCUTANEOUS | 2 refills | Status: DC

## 2021-01-18 NOTE — Addendum Note (Signed)
Addended by: Scharlene Gloss B on: 01/18/2021 11:30 AM   Modules accepted: Orders

## 2021-01-18 NOTE — Progress Notes (Signed)
Subjective:   Chief Complaint  Patient presents with   Follow-up    Jasmine Higgins is a 45 y.o. female here for follow-up of diabetes.   Edita does not ck sugars routinely.  Patient does not require insulin.   Medications include: Metformin 500 mg bid Diet is healthy.  Exercise: walking No CP or SOB.   Hypertension Patient presents for hypertension follow up. She does monitor home blood pressures. Blood pressures ranging on average from 120's/70-80's. She is compliant with medications- Exforge 10-320. Patient has these side effects of medication: none Diet/exercise as above.   Past Medical History:  Diagnosis Date   Asthma    Depression    History of PCOS    Hypertension      Related testing: Retinal exam: scheduled in 4 d Pneumovax: done  Objective:  BP 128/78   Pulse (!) 102   Temp 98.6 F (37 C) (Oral)   Ht 5\' 4"  (1.626 m)   Wt 229 lb (103.9 kg)   SpO2 96%   BMI 39.31 kg/m  General:  Well developed, well nourished, in no apparent distress Skin:  Warm, no pallor or diaphoresis Lungs:  CTAB, no access msc use Cardio:  RRR, no bruits, no LE edema Psych: Age appropriate judgment and insight  Assessment:   Diabetes mellitus type 2 in obese (HCC) - Plan: Semaglutide,0.25 or 0.5MG /DOS, (OZEMPIC, 0.25 OR 0.5 MG/DOSE,) 2 MG/1.5ML SOPN, Comprehensive metabolic panel, Hemoglobin A1c, Lipid panel  Class 2 severe obesity with body mass index (BMI) of 35 to 39.9 with serious comorbidity (HCC) - Plan: Semaglutide,0.25 or 0.5MG /DOS, (OZEMPIC, 0.25 OR 0.5 MG/DOSE,) 2 MG/1.5ML SOPN  Essential hypertension   Plan:   Chronic, stable. Cont Metformin 500 mg/d. Counseled on diet and exercise. Chronic, uncontrolled. Add back Ozempic. Chronic, stable. Cont exforge 10-320 mg/d.  F/u in 6 mo. The patient voiced understanding and agreement to the plan.  [place], DO 01/18/21 11:25 AM

## 2021-01-18 NOTE — Patient Instructions (Signed)
Give [place] 2-3 business days to get the results of your labs back.   For the swelling in your lower extremities, be sure to elevate your legs when able, mind the salt intake, stay physically active and consider wearing compression stockings.  Aim to do some physical exertion for 150 minutes per week. This is typically divided into 5 days per week, 30 minutes per day. The activity should be enough to get your heart rate up. Anything is better than nothing if you have time constraints. Consider weight resistance exercise.   Let us know if you need anything.

## 2021-01-20 DIAGNOSIS — E1169 Type 2 diabetes mellitus with other specified complication: Secondary | ICD-10-CM

## 2021-03-09 IMAGING — MG DIGITAL SCREENING BILAT W/ TOMO W/ CAD
8 series · 8 of 24 positions shown · non-contrast
Comparison: None.

CLINICAL DATA: Screening. Baseline.

EXAM:
DIGITAL SCREENING BILATERAL MAMMOGRAM WITH TOMO AND CAD

[L MLO synth-2D]
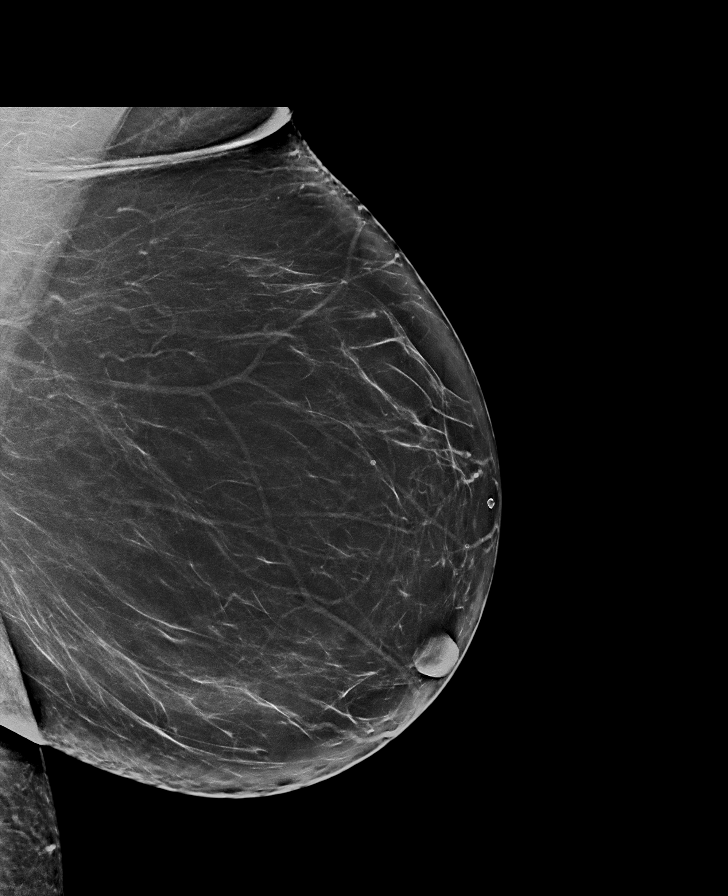

[L CC synth-2D]
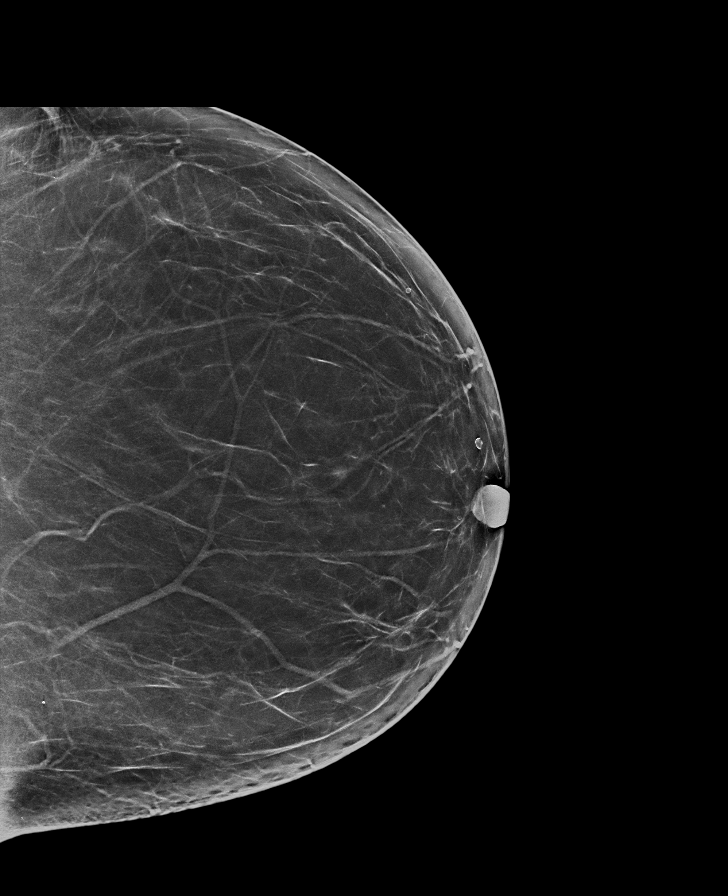

[R CC synth-2D]
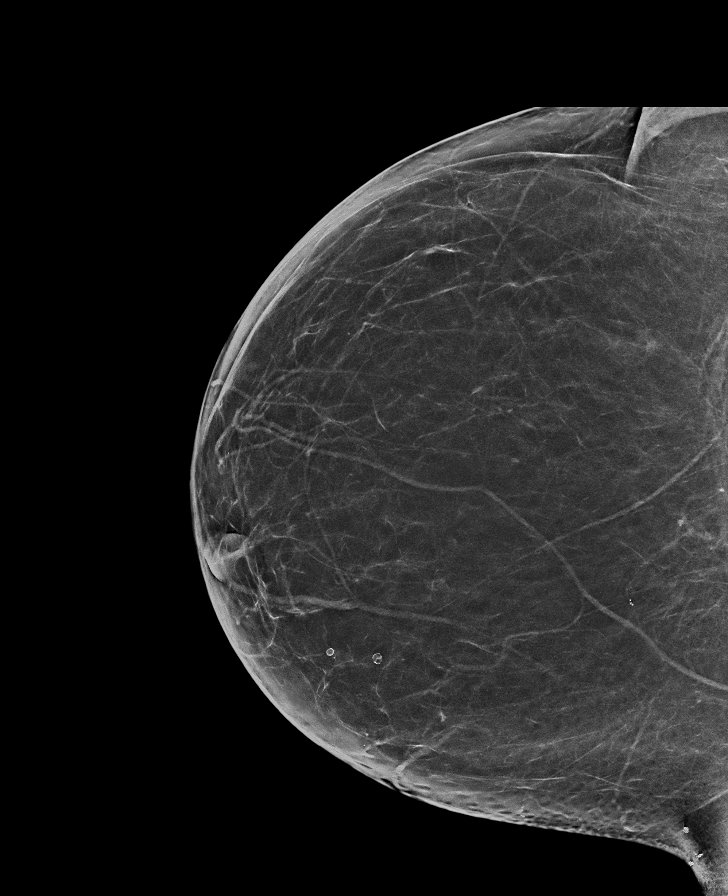

[R MLO synth-2D]
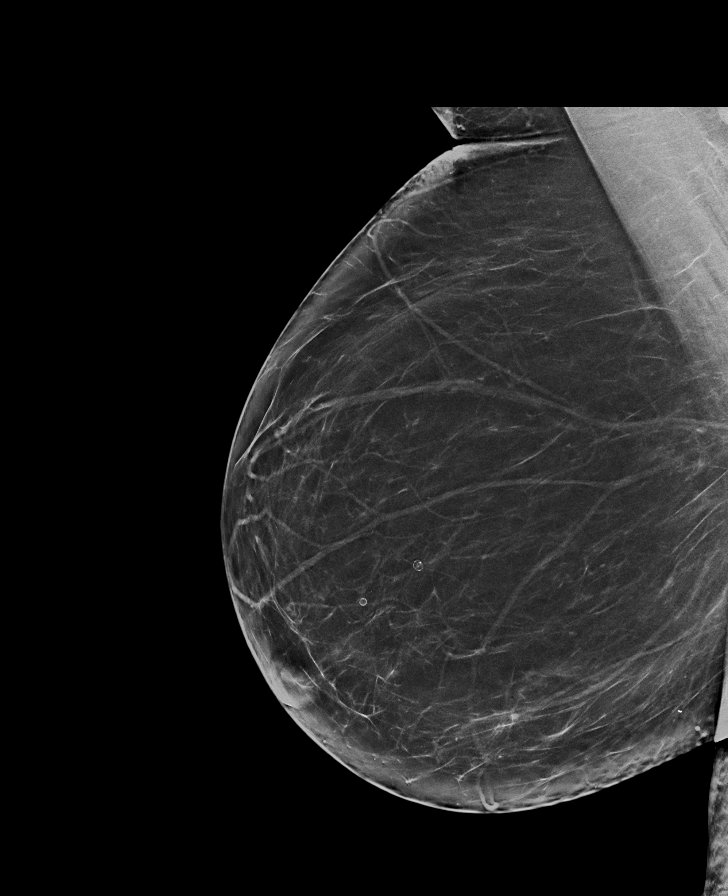

[L MLO tomo · tomo slice 45/90.0]
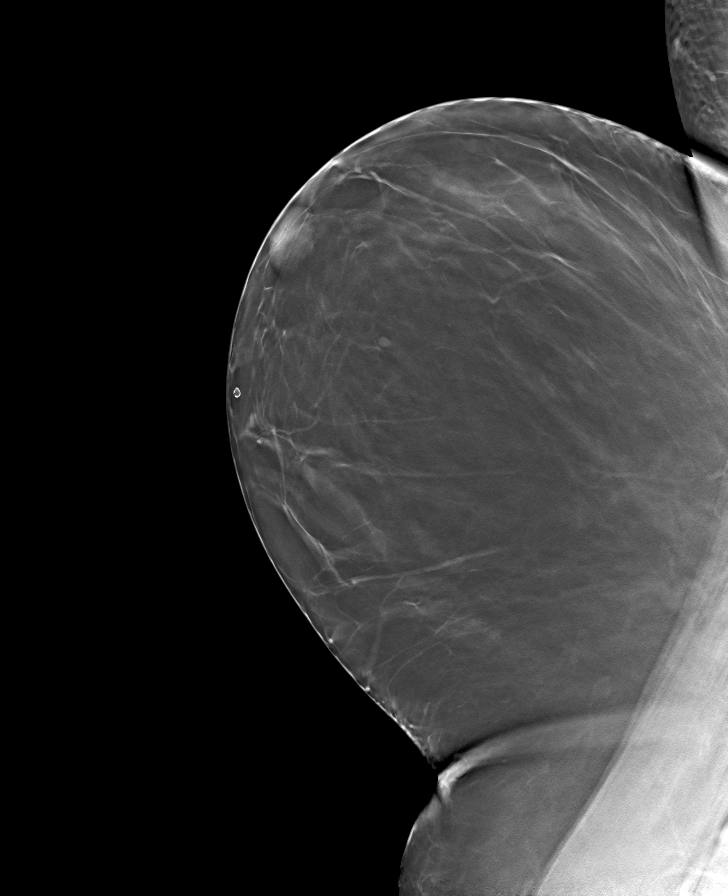

[R MLO tomo · tomo slice 42/83.0]
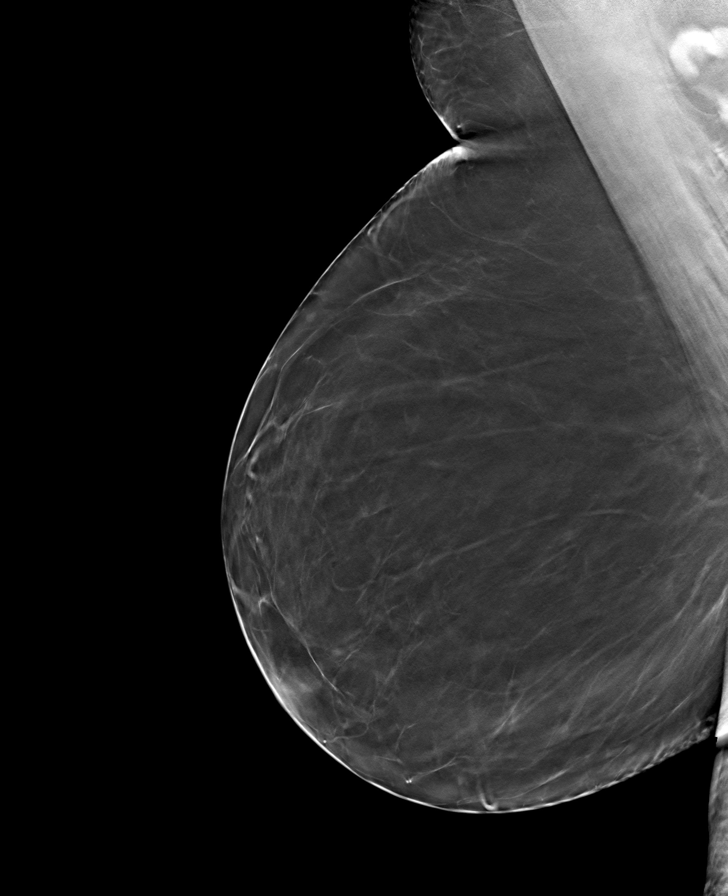

[R CC tomo · tomo slice 37/73.0]
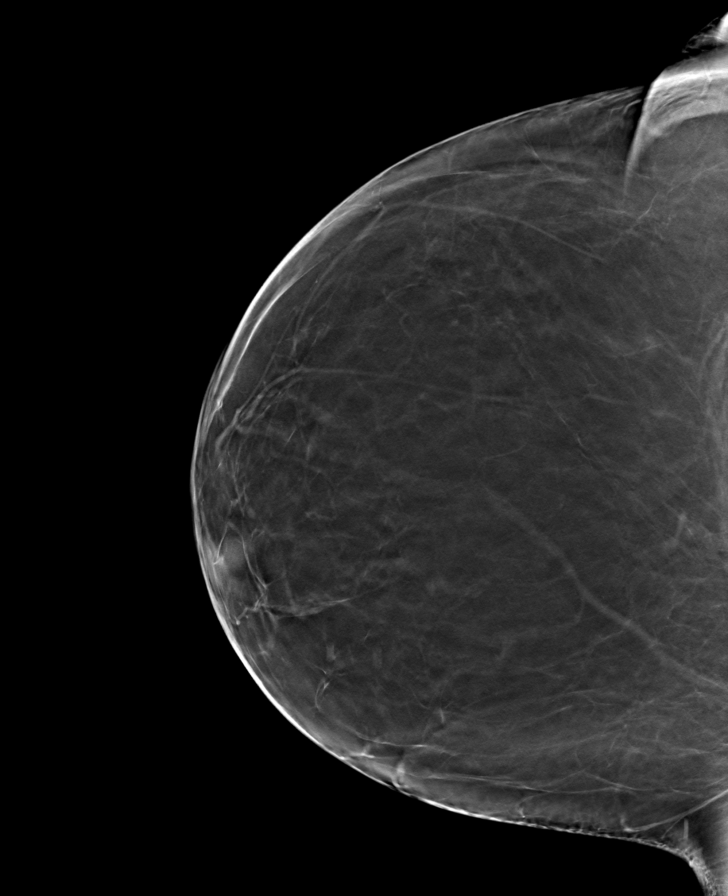

[L CC tomo · tomo slice 39/77.0]
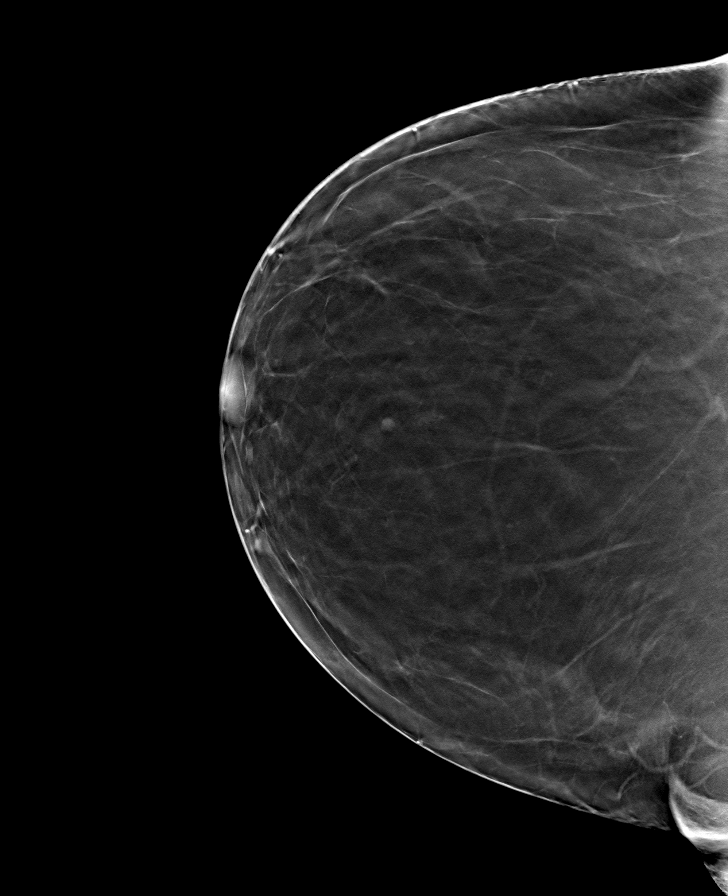

[8 of 24 positions shown; findings below may reference images not displayed]

ACR Breast Density Category b: There are scattered areas of
fibroglandular density.
FINDINGS: In the left breast, a possible mass warrants further evaluation.
This small mass is seen within the slightly outer LEFT breast,
anterior to middle depth, tomosynthesis CC slice 42.

In the right breast, no findings suspicious for malignancy. Images
were processed with CAD.
IMPRESSION: Further evaluation is suggested for possible mass in the left
breast.

RECOMMENDATION:
Ultrasound of the left breast. (Code:[id number]-Z-SSN)

The patient will be contacted regarding the findings, and additional
imaging will be scheduled.

BI-RADS CATEGORY  0: Incomplete. Need additional imaging evaluation
and/or prior mammograms for comparison.

## 2021-05-17 ENCOUNTER — Ambulatory Visit (INDEPENDENT_AMBULATORY_CARE_PROVIDER_SITE_OTHER): Payer: 59 | Admitting: Family Medicine

## 2021-05-17 ENCOUNTER — Encounter: Payer: Self-pay | Admitting: Family Medicine

## 2021-05-17 VITALS — BP 122/86 | HR 84 | Temp 98.8°F | Ht 64.0 in | Wt 224.0 lb

## 2021-05-17 DIAGNOSIS — E1169 Type 2 diabetes mellitus with other specified complication: Secondary | ICD-10-CM

## 2021-05-17 DIAGNOSIS — E7849 Other hyperlipidemia: Secondary | ICD-10-CM

## 2021-05-17 DIAGNOSIS — E669 Obesity, unspecified: Secondary | ICD-10-CM

## 2021-05-17 DIAGNOSIS — J302 Other seasonal allergic rhinitis: Secondary | ICD-10-CM

## 2021-05-17 DIAGNOSIS — Z0000 Encounter for general adult medical examination without abnormal findings: Secondary | ICD-10-CM | POA: Diagnosis not present

## 2021-05-17 LAB — COMPREHENSIVE METABOLIC PANEL
ALT: 38 U/L — ABNORMAL HIGH (ref 0–35)
AST: 22 U/L (ref 0–37)
Albumin: 4 g/dL (ref 3.5–5.2)
Alkaline Phosphatase: 74 U/L (ref 39–117)
BUN: 15 mg/dL (ref 6–23)
CO2: 27 mEq/L (ref 19–32)
Calcium: 9.5 mg/dL (ref 8.4–10.5)
Chloride: 102 mEq/L (ref 96–112)
Creatinine, Ser: 0.64 mg/dL (ref 0.40–1.20)
GFR: 107.23 mL/min (ref >60.00)
Glucose, Bld: 133 mg/dL — ABNORMAL HIGH (ref 70–99)
Potassium: 4.4 mEq/L (ref 3.5–5.1)
Sodium: 137 mEq/L (ref 135–145)
Total Bilirubin: 0.4 mg/dL (ref 0.2–1.2)
Total Protein: 7.2 g/dL (ref 6.0–8.3)

## 2021-05-17 LAB — LIPID PANEL
Cholesterol: 162 mg/dL (ref 0–200)
HDL: 42.7 mg/dL (ref >39.00)
LDL Cholesterol: 97 mg/dL (ref 0–99)
NonHDL: 118.83
Total CHOL/HDL Ratio: 4
Triglycerides: 108 mg/dL (ref 0.0–149.0)
VLDL: 21.6 mg/dL (ref 0.0–40.0)

## 2021-05-17 LAB — CBC
HCT: 41.5 % (ref 36.0–46.0)
Hemoglobin: 13.7 g/dL (ref 12.0–15.0)
MCHC: 33 g/dL (ref 30.0–36.0)
MCV: 88.2 fl (ref 78.0–100.0)
Platelets: 417 10*3/uL — ABNORMAL HIGH (ref 150.0–400.0)
RBC: 4.71 Mil/uL (ref 3.87–5.11)
RDW: 14.3 % (ref 11.5–15.5)
WBC: 9.2 10*3/uL (ref 4.0–10.5)

## 2021-05-17 LAB — HEMOGLOBIN A1C: Hgb A1c MFr Bld: 6.1 % (ref 4.6–6.5)

## 2021-05-17 MED ORDER — SEMAGLUTIDE (1 MG/DOSE) 4 MG/3ML ~~LOC~~ SOPN: 1.0000 mg | PEN_INJECTOR | SUBCUTANEOUS | 5 refills | Status: AC

## 2021-05-17 MED ORDER — MONTELUKAST SODIUM 10 MG PO TABS: 10.0000 mg | ORAL_TABLET | Freq: Every day | ORAL | 2 refills | Status: AC

## 2021-05-17 MED ORDER — ATORVASTATIN CALCIUM 40 MG PO TABS: ORAL_TABLET | ORAL | 3 refills | Status: AC

## 2021-05-17 MED ORDER — METFORMIN HCL 1000 MG PO TABS: ORAL_TABLET | ORAL | 2 refills | Status: AC

## 2021-05-17 MED ORDER — OZEMPIC (0.25 OR 0.5 MG/DOSE) 2 MG/1.5ML ~~LOC~~ SOPN: PEN_INJECTOR | SUBCUTANEOUS | 2 refills | Status: DC

## 2021-05-17 NOTE — Progress Notes (Signed)
Chief Complaint  Patient presents with   Follow-up     Well Woman Jasmine Higgins is here for a complete physical.   Her last physical was >1 year ago.  Current diet: in general, a "healthy" diet. Current exercise: walking. Weight is stable and she denies fatigue out of ordinary. Seatbelt? Yes Advanced directive? No  Health Maintenance Pap/HPV- Yes Mammogram- Due Tetanus- Yes Hep C screening- Yes HIV screening- Yes  Past Medical History:  Diagnosis Date   Asthma    Depression    History of PCOS    Hypertension      Past Surgical History:  Procedure Laterality Date   NO PAST SURGERIES      Medications  Current Outpatient Medications on File Prior to Visit  Medication Sig Dispense Refill   albuterol (VENTOLIN HFA) 108 (90 Base) MCG/ACT inhaler Inhale 1-2 puffs into the lungs every 6 (six) hours as needed for wheezing or shortness of breath. 18 g 2   amLODipine-valsartan (EXFORGE) 10-320 MG tablet Take 1 tablet by mouth daily. 90 tablet 5   atorvastatin (LIPITOR) 40 MG tablet TAKE 1 TABLET(40 MG) BY MOUTH DAILY 90 tablet 3   metFORMIN (GLUCOPHAGE) 1000 MG tablet Take 1/2 tab twice daily. 180 tablet 2   montelukast (SINGULAIR) 10 MG tablet Take 1 tablet (10 mg total) by mouth at bedtime. 90 tablet 2   Semaglutide,0.25 or 0.5MG /DOS, (OZEMPIC, 0.25 OR 0.5 MG/DOSE,) 2 MG/1.5ML SOPN Inject 0.25 mg for the first 2 doses then increase to 0.5 mg into the skin once a week 6 mL 2    Allergies Allergies  Allergen Reactions   Lisinopril Cough    Side effect    Review of Systems: Constitutional:  no unexpected weight changes Eye:  no recent significant change in vision Ear/Nose/Mouth/Throat:  Ears:  no recent change in hearing Nose/Mouth/Throat:  no complaints of nasal congestion, no sore throat Cardiovascular: no chest pain Respiratory:  no shortness of breath Gastrointestinal:  no abdominal pain, no change in bowel habits GU:  Female: negative for dysuria or pelvic  pain Musculoskeletal/Extremities:  no pain of the joints Integumentary (Skin/Breast):  no abnormal skin lesions reported Neurologic:  no headaches Endocrine:  denies fatigue Hematologic/Lymphatic:  No areas of easy bleeding  Exam BP 122/86    Pulse 84    Temp 98.8 F (37.1 C) (Oral)    Ht 5\' 4"  (1.626 m)    Wt 224 lb (101.6 kg)    SpO2 96%    BMI 38.45 kg/m  General:  well developed, well nourished, in no apparent distress Skin:  no significant moles, warts, or growths Head:  no masses, lesions, or tenderness Eyes:  pupils equal and round, sclera anicteric without injection Ears:  canals without lesions, TMs shiny without retraction, no obvious effusion, no erythema Nose:  nares patent, septum midline, mucosa normal, and no drainage or sinus tenderness Throat/Pharynx:  lips and gingiva without lesion; tongue and uvula midline; non-inflamed pharynx; no exudates or postnasal drainage Neck: neck supple without adenopathy, thyromegaly, or masses Lungs:  clear to auscultation, breath sounds equal bilaterally, no respiratory distress Cardio:  regular rate and rhythm, no LE edema Abdomen:  abdomen soft, nontender; bowel sounds normal; no masses or organomegaly Genital: Defer to GYN Musculoskeletal:  symmetrical muscle groups noted without atrophy or deformity Extremities:  no clubbing, cyanosis, or edema, no deformities, no skin discoloration Neuro:  gait normal; deep tendon reflexes normal and symmetric Psych: well oriented with normal range of affect and appropriate judgment/insight  Assessment and Plan  Well adult exam - Plan: CBC, Comprehensive metabolic panel, Lipid panel, Hemoglobin A1c  Diabetes mellitus type 2 in obese (HCC) - Plan: metFORMIN (GLUCOPHAGE) 1000 MG tablet, atorvastatin (LIPITOR) 40 MG tablet, DISCONTINUED: Semaglutide,0.25 or 0.5MG /DOS, (OZEMPIC, 0.25 OR 0.5 MG/DOSE,) 2 MG/1.5ML SOPN  Class 2 severe obesity with body mass index (BMI) of 35 to 39.9 with serious  comorbidity (HCC) - Plan: DISCONTINUED: Semaglutide,0.25 or 0.5MG /DOS, (OZEMPIC, 0.25 OR 0.5 MG/DOSE,) 2 MG/1.5ML SOPN  Other hyperlipidemia - Plan: atorvastatin (LIPITOR) 40 MG tablet  Seasonal allergies - Plan: montelukast (SINGULAIR) 10 MG tablet   Well 46 y.o. female. Counseled on diet and exercise. Other orders as above. Advanced directive form provided today.  Bivalent COVID vaccination booster recommended.  Moving next month. She will follow up down in [place].  The patient voiced understanding and agreement to the plan.  Jilda Roche [place], DO 05/17/21 9:20 AM

## 2021-05-17 NOTE — Patient Instructions (Addendum)
Give [place] 2-3 business days to get the results of your labs back.   Keep the diet clean and stay active.  Good luck in the future. Please let me know if you need any refills before setting up with your new PCP.   Don't get pregnant until you meet your new PCP because you are on medicines that could harm the baby.   I recommend getting the updated bivalent covid vaccination booster at your convenience.   Let us know if you need anything.

## 2021-06-21 ENCOUNTER — Encounter: Payer: Self-pay | Admitting: Family Medicine

## 2021-06-21 ENCOUNTER — Other Ambulatory Visit: Payer: Self-pay | Admitting: Family Medicine

## 2021-06-21 MED ORDER — ALBUTEROL SULFATE 0.63 MG/3ML IN NEBU: 1.0000 | INHALATION_SOLUTION | Freq: Four times a day (QID) | RESPIRATORY_TRACT | 0 refills | Status: DC | PRN

## 2021-08-24 ENCOUNTER — Encounter: Payer: Self-pay | Admitting: Family Medicine

## 2021-08-24 MED ORDER — ALBUTEROL SULFATE 0.63 MG/3ML IN NEBU: 1.0000 | INHALATION_SOLUTION | Freq: Four times a day (QID) | RESPIRATORY_TRACT | 6 refills | Status: AC | PRN

## 2021-09-18 ENCOUNTER — Other Ambulatory Visit: Payer: Self-pay | Admitting: Family Medicine

## 2021-09-18 DIAGNOSIS — I10 Essential (primary) hypertension: Secondary | ICD-10-CM

## 2021-09-20 MED ORDER — AMLODIPINE BESYLATE-VALSARTAN 10-320 MG PO TABS: 1.0000 | ORAL_TABLET | Freq: Every day | ORAL | 3 refills | Status: AC

## 2021-09-20 NOTE — Addendum Note (Signed)
Addended by: Sharon Seller B on: 09/20/2021 08:29 AM   Modules accepted: Orders

## 2022-12-01 NOTE — Telephone Encounter (Signed)
Pcp removed
